# Patient Record
Sex: Male | Born: 1995 | Race: White | Hispanic: No | Marital: Single | Smoking: Never smoker
Health system: Southern US, Community
[De-identification: ages and names within clinical notes are randomized; demographics above are authoritative.]

## PROBLEM LIST (undated history)

## (undated) DIAGNOSIS — T7840XA Allergy, unspecified, initial encounter: Secondary | ICD-10-CM

## (undated) DIAGNOSIS — F329 Major depressive disorder, single episode, unspecified: Secondary | ICD-10-CM

## (undated) DIAGNOSIS — F32A Depression, unspecified: Secondary | ICD-10-CM

## (undated) DIAGNOSIS — L309 Dermatitis, unspecified: Secondary | ICD-10-CM

## (undated) DIAGNOSIS — B009 Herpesviral infection, unspecified: Secondary | ICD-10-CM

## (undated) DIAGNOSIS — F419 Anxiety disorder, unspecified: Secondary | ICD-10-CM

## (undated) HISTORY — DX: Major depressive disorder, single episode, unspecified: F32.9

## (undated) HISTORY — DX: Allergy, unspecified, initial encounter: T78.40XA

## (undated) HISTORY — DX: Anxiety disorder, unspecified: F41.9

## (undated) HISTORY — PX: TONSILLECTOMY: SUR1361

## (undated) HISTORY — DX: Depression, unspecified: F32.A

---
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
## 2015-04-03 ENCOUNTER — Emergency Department (HOSPITAL_COMMUNITY)
Admission: EM | Admit: 2015-04-03 | Discharge: 2015-04-04 | Disposition: A | Discharge status: Other Acute Inpatient Hospital | Payer: BC Managed Care – PPO | Attending: Emergency Medicine | Admitting: Emergency Medicine

## 2015-04-03 ENCOUNTER — Encounter (HOSPITAL_COMMUNITY): Payer: Self-pay | Admitting: Emergency Medicine

## 2015-04-03 DIAGNOSIS — Z880 Allergy status to penicillin: Secondary | ICD-10-CM | POA: Insufficient documentation

## 2015-04-03 DIAGNOSIS — Z872 Personal history of diseases of the skin and subcutaneous tissue: Secondary | ICD-10-CM | POA: Insufficient documentation

## 2015-04-03 DIAGNOSIS — F32A Depression, unspecified: Secondary | ICD-10-CM

## 2015-04-03 DIAGNOSIS — Z8619 Personal history of other infectious and parasitic diseases: Secondary | ICD-10-CM | POA: Insufficient documentation

## 2015-04-03 DIAGNOSIS — F329 Major depressive disorder, single episode, unspecified: Secondary | ICD-10-CM | POA: Diagnosis not present

## 2015-04-03 DIAGNOSIS — R45851 Suicidal ideations: Secondary | ICD-10-CM | POA: Diagnosis not present

## 2015-04-03 DIAGNOSIS — F322 Major depressive disorder, single episode, severe without psychotic features: Secondary | ICD-10-CM | POA: Diagnosis not present

## 2015-04-03 HISTORY — DX: Herpesviral infection, unspecified: B00.9

## 2015-04-03 HISTORY — DX: Dermatitis, unspecified: L30.9

## 2015-04-03 LAB — COMPREHENSIVE METABOLIC PANEL
ALT: 13 U/L — ABNORMAL LOW (ref 17–63)
AST: 17 U/L (ref 15–41)
Albumin: 4.7 g/dL (ref 3.5–5.0)
Alkaline Phosphatase: 51 U/L (ref 38–126)
Anion gap: 10 (ref 5–15)
BUN: 13 mg/dL (ref 6–20)
CHLORIDE: 105 mmol/L (ref 101–111)
CO2: 24 mmol/L (ref 22–32)
Calcium: 9.8 mg/dL (ref 8.9–10.3)
Creatinine, Ser: 0.85 mg/dL (ref 0.61–1.24)
GFR calc Af Amer: >60 mL/min (ref >60)
Glucose, Bld: 94 mg/dL (ref 65–99)
POTASSIUM: 3.8 mmol/L (ref 3.5–5.1)
SODIUM: 139 mmol/L (ref 135–145)
Total Bilirubin: 0.9 mg/dL (ref 0.3–1.2)
Total Protein: 7.9 g/dL (ref 6.5–8.1)

## 2015-04-03 LAB — CBC
HCT: 44.5 % (ref 39.0–52.0)
HEMOGLOBIN: 15.6 g/dL (ref 13.0–17.0)
MCH: 31 pg (ref 26.0–34.0)
MCHC: 35.1 g/dL (ref 30.0–36.0)
MCV: 88.3 fL (ref 78.0–100.0)
PLATELETS: 367 10*3/uL (ref 150–400)
RBC: 5.04 MIL/uL (ref 4.22–5.81)
RDW: 13 % (ref 11.5–15.5)
WBC: 12.5 10*3/uL — AB (ref 4.0–10.5)

## 2015-04-03 LAB — ACETAMINOPHEN LEVEL: Acetaminophen (Tylenol), Serum: <10 ug/mL — ABNORMAL LOW (ref 10–30)

## 2015-04-03 LAB — SALICYLATE LEVEL

## 2015-04-03 LAB — ETHANOL

## 2015-04-03 MED ORDER — ONDANSETRON HCL 4 MG PO TABS: 4.0000 mg | ORAL_TABLET | Freq: Three times a day (TID) | ORAL | Status: DC | PRN

## 2015-04-03 MED ORDER — ALUM & MAG HYDROXIDE-SIMETH 200-200-20 MG/5ML PO SUSP: 30.0000 mL | ORAL | Status: DC | PRN

## 2015-04-03 MED ORDER — ACETAMINOPHEN 325 MG PO TABS: 650.0000 mg | ORAL_TABLET | ORAL | Status: DC | PRN

## 2015-04-03 MED ORDER — IBUPROFEN 200 MG PO TABS: 600.0000 mg | ORAL_TABLET | Freq: Three times a day (TID) | ORAL | Status: DC | PRN

## 2015-04-03 MED ORDER — ZOLPIDEM TARTRATE 5 MG PO TABS: 5.0000 mg | ORAL_TABLET | Freq: Every evening | ORAL | Status: DC | PRN

## 2015-04-03 MED ORDER — NICOTINE 21 MG/24HR TD PT24: 21.0000 mg | MEDICATED_PATCH | Freq: Every day | TRANSDERMAL | Status: DC | PRN

## 2015-04-03 NOTE — ED Provider Notes (Signed)
CSN: [id number]     Arrival date & time 04/03/15  1620 History   First MD Initiated Contact with Patient 04/03/15 1739     Chief Complaint  Patient presents with  . Suicidal     (Consider location/radiation/quality/duration/timing/severity/associated sxs/prior Treatment) HPI   Steve Hodge is a 19 y.o. male who presents for evaluation of suicidal ideation, that has progressed over the last 2 months, and he now feels that he cannot keep himself safe. He has thought about jumping in front of a car. He is here to seek help at the request of his therapist. He takes hydroxyzine for anxiety, but nothing for depression. He has never had a suicide attempt. He has not had any recent illnesses. There are no other known modifying factors.   Past Medical History  Diagnosis Date  . Eczema   . HSV-1 infection    History reviewed. No pertinent past surgical history. History reviewed. No pertinent family history. Social History  Substance Use Topics  . Smoking status: Never Smoker   . Smokeless tobacco: None  . Alcohol Use: No    Review of Systems  All other systems reviewed and are negative.     Allergies  Amoxicillin  Home Medications   Prior to Admission medications   Medication Sig Start Date End Date Taking? Authorizing Provider  hydrOXYzine (ATARAX/VISTARIL) 10 MG tablet Take 1 tablet by mouth 2 (two) times daily as needed for anxiety.  03/25/15  Yes Historical Provider, MD  valACYclovir (VALTREX) 500 MG tablet Take 3-4 tablets by mouth daily as needed. Breakout. 02/26/15  Yes Historical Provider, MD   BP 115/60 mmHg  Pulse 63  Temp(Src) 97.3 F (36.3 C) (Oral)  Resp 16  SpO2 99% Physical Exam  Constitutional: He is oriented to person, place, and time. He appears well-developed and well-nourished. He appears distressed (he is uncomfortable).  HENT:  Head: Normocephalic and atraumatic.  Right Ear: External ear normal.  Left Ear: External ear normal.  Eyes:  Conjunctivae and EOM are normal. Pupils are equal, round, and reactive to light.  Neck: Normal range of motion and phonation normal. Neck supple.  Cardiovascular: Normal rate.   Pulmonary/Chest: Effort normal. He exhibits no bony tenderness.  Abdominal: Soft. There is no tenderness.  Musculoskeletal: Normal range of motion.  Neurological: He is alert and oriented to person, place, and time. No cranial nerve deficit or sensory deficit. He exhibits normal muscle tone. Coordination normal.  Skin: Skin is warm, dry and intact.  Psychiatric: His behavior is normal. Judgment and thought content normal.  Depressed, tearful  Nursing note and vitals reviewed.   ED Course  Procedures (including critical care time)  Medications  acetaminophen (TYLENOL) tablet 650 mg (not administered)  ibuprofen (ADVIL,MOTRIN) tablet 600 mg (not administered)  zolpidem (AMBIEN) tablet 5 mg (not administered)  nicotine (NICODERM CQ - dosed in mg/24 hours) patch 21 mg (not administered)  ondansetron (ZOFRAN) tablet 4 mg (not administered)  alum & mag hydroxide-simeth (MAALOX/MYLANTA) 200-200-20 MG/5ML suspension 30 mL (not administered)    Patient Vitals for the past 24 hrs:  BP Temp Temp src Pulse Resp SpO2  04/03/15 1702 115/60 mmHg 97.3 F (36.3 C) Oral 63 16 99 %    Consult TTS  9:37 PM Reevaluation with update and discussion. After initial assessment and treatment, an updated evaluation reveals no change in clinical status. Patient has spoken to TTS, and wants to go home at this time. He states that he will not act on his thoughts. I  explained to him that I was not comfortable with him leaving at this time. He understands and will stay to talk to the psychiatrist. He is at increased risk for suicide if leaving, and therefore should be committed if he tries to leave. Steve Hodge L    Labs Review Labs Reviewed  COMPREHENSIVE METABOLIC PANEL - Abnormal; Notable for the following:    ALT 13 (*)    All  other components within normal limits  ACETAMINOPHEN LEVEL - Abnormal; Notable for the following:    Acetaminophen (Tylenol), Serum <10 (*)    All other components within normal limits  CBC - Abnormal; Notable for the following:    WBC 12.5 (*)    All other components within normal limits  ETHANOL  SALICYLATE LEVEL  URINE RAPID DRUG SCREEN, HOSP PERFORMED    Imaging Review No results found. I have personally reviewed and evaluated these images and lab results as part of my medical decision-making.   EKG Interpretation None      MDM   Final diagnoses:  Suicidal ideation  Depression    Depression suicidal ideation, worsening, and not currently taking medications. Patient will be kept overnight for evaluation by psychiatry in the morning.  Nursing Notes Reviewed/ Care Coordinated, and agree without changes. Applicable Imaging Reviewed.  Interpretation of Laboratory Data incorporated into ED treatment  Plan: As per TTS in conjunction with oncoming provider team  Steve BaleElliott Kafi Dotter, MD 04/03/15 2139

## 2015-04-03 NOTE — ED Notes (Signed)
Patient is A&Ox4, denies SI/HI and A/V hallucinations. Patient tearful, sad, crying. Patient spoke about being lonely and needing someone to talk to. Patient's girlfriend broke up with him 2 months ago. He has [facility], he wrote a letter to his ex-girlfriend as part of his therapy and stated he felt good about the letter prior to sending it. Patient stated he texted his girlfriend today to see if she had gotten the letter. She told him she had and that she was happy with her life as it is now and will be moving to [place]. Patient went to the counseling center and they told him to come here because he had SI with 2 tentative plans, to OD on his medicine or to walk out into traffic. Patient currently states he is not SI but will let staff know if he no longer feels safe. Patient spoke about his [place] in [place], his sister lives in [place], and his brother lives in [place]. Patient also stated he has some friends here, but no one really close because it had always been him and his girlfriend.   Nelsy Madonna, Wyman SongsterAngela Marie, RN

## 2015-04-03 NOTE — BH Assessment (Signed)
Assessment completed. Consulted Hulan FessIjeoma Nwaeze, NP who agrees that pt should be re-evaluated in the am by psychiatry. Informed Dr. Effie ShyWentz of the recommendation.

## 2015-04-03 NOTE — ED Notes (Signed)
Pt reports SI with plan to jump in front of car or overdose on medications. Denies HI or substance abuse. Depression worsened by break up with girlfriend 2 months ago.

## 2015-04-03 NOTE — ED Notes (Signed)
Fathers Phone Number, [phone]. Steve Findersony Grave.

## 2015-04-03 NOTE — BH Assessment (Addendum)
Tele Assessment Note   Steve Hodge is an 19 y.o. male presenting to [facility] due to increasing depression. Pt stated "I am having trouble". "I went to the [facility] for a walk in session". "I was having a bad day". "I feel really depressed and sad". "I really just wanted to talk to someone". "I have thoughts it would be easier if this was over maybe this car could hit me". "No serious plans just the thought that it would injure me". "I feel miserable and I feel alone". Pt denies SI, HI and AVH at this time; however pt was referred to the ED by his therapist at [facility]. It has been documented by the EDP that pt feels that he cannot keep himself safe and he has thought about jumping in front of a car. Pt did not report any previous suicide attempts or self-injurious behaviors. Pt reported that his girlfriend broke up with him 2 months ago and he has been having trouble dealing with it. He is endorsing multiple depressive symptoms and shared that he has lost 10 lbs in the past 2 months. Pt also shared that he has trouble concentrating and stated "I can only think about the relationship and trying to fix it". "It's hard to get any work done". Pt reported that he drinks alcohol socially. Pt did not report any previous psychiatric hospitalizations and he has been having sessions at [facility] counseling center consistently since the breakup. PT did not report any physical, sexual or emotional abuse at this time.  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 Pt stated "I would like to be able to sleep in my own bed". "I would like to know how to feel better".  It is recommended that pt be evaluated by psychiatry in the am.   Diagnosis: Major Depressive Disorder, Single Episode, Moderate   Past Medical History:  Past Medical History  Diagnosis Date  . Eczema   . HSV-1 infection     History reviewed. No pertinent past surgical history.  Family History: History reviewed. No  pertinent family history.  Social History:  reports that he has never smoked. He does not have any smokeless tobacco history on file. He reports that he does not drink alcohol or use illicit drugs.  Additional Social History:     CIWA: CIWA-Ar BP: 115/60 mmHg Pulse Rate: 63 COWS:    PATIENT STRENGTHS: (choose at least two) Average or above average intelligence Supportive family/friends  Allergies:  Allergies  Allergen Reactions  . Amoxicillin     Had childhood reaction    Home Medications:  (Not in a hospital admission)  OB/GYN Status:  No LMP for male patient.  General Assessment Data Location of Assessment: [facility] ED TTS Assessment: In system Is this a Tele or Face-to-Face Assessment?: Face-to-Face Is this an Initial Assessment or a Re-assessment for this encounter?: Initial Assessment Marital status: Single Living Arrangements: Non-relatives/Friends Can pt return to current living arrangement?: Yes Admission Status: Voluntary Is patient capable of signing voluntary admission?: Yes Referral Source: Self/Family/Friend Insurance type: BCBS     Crisis Care Plan Living Arrangements: Non-relatives/Friends Name of Psychiatrist: [facility] Name of Therapist: [facility]  Education Status Is patient currently in school?: Yes Current Grade: Junior Highest grade of school patient has completed: Sophomore Name of school: Haematologist person: N/A  Risk to self with the past 6 months Suicidal Ideation: No-Not Currently/Within Last 6 Months Has patient been a risk to self within the  past 6 months prior to admission? : No Suicidal Intent: No-Not Currently/Within Last 6 Months Has patient had any suicidal intent within the past 6 months prior to admission? : No Is patient at risk for suicide?: Yes Suicidal Plan?: No-Not Currently/Within Last 6 Months Has patient had any suicidal plan within the past 6 months prior to admission? : No Access to Means: No What has been your use of  drugs/alcohol within the last 12 months?: Pt reported that he drinks alcohol socially.  Previous Attempts/Gestures: No How many times?: 0 Other Self Harm Risks: No other self harm risk identified at this time.   Triggers for Past Attempts: None known Intentional Self Injurious Behavior: None Family Suicide History: No Recent stressful life event(s): Other (Comment) (Break up with girlfriend ) Persecutory voices/beliefs?: No Depression: Yes Depression Symptoms: Despondent, Tearfulness, Fatigue, Loss of interest in usual pleasures, Feeling worthless/self pity, Guilt Substance abuse history and/or treatment for substance abuse?: No Suicide prevention information given to non-admitted patients: Not applicable  Risk to Others within the past 6 months Homicidal Ideation: No Does patient have any lifetime risk of violence toward others beyond the six months prior to admission? : No Thoughts of Harm to Others: No Current Homicidal Intent: No Current Homicidal Plan: No Access to Homicidal Means: No Identified Victim: N/A History of harm to others?: No Assessment of Violence: On admission Violent Behavior Description: No violent behaviors observed.  Does patient have access to weapons?: No Criminal Charges Pending?: No Does patient have a court date: No Is patient on probation?: No  Psychosis Hallucinations: None noted Delusions: None noted  Mental Status Report Appearance/Hygiene: In scrubs Eye Contact: Poor Motor Activity: Freedom of movement Speech: Logical/coherent Level of Consciousness: Quiet/awake Mood: Depressed, Sad Affect: Blunted Anxiety Level: Minimal Thought Processes: Coherent, Relevant Judgement: Unimpaired Orientation: Person, Place, Time, Situation Obsessive Compulsive Thoughts/Behaviors: Minimal  Cognitive Functioning Concentration: Decreased Memory: Recent Intact, Remote Intact IQ: Average Insight: Fair Impulse Control: Good Appetite: Fair Weight Loss:  10 (2 months ) Weight Gain: 0 Sleep: No Change Total Hours of Sleep: 6 Vegetative Symptoms: None  ADLScreening [facility] Assessment Services) Patient's cognitive ability adequate to safely complete daily activities?: Yes Patient able to express need for assistance with ADLs?: Yes Independently performs ADLs?: Yes (appropriate for developmental age)  Prior Inpatient Therapy Prior Inpatient Therapy: No Prior Therapy Dates: N/A Prior Therapy Facilty/Provider(s): N/A Reason for Treatment: N/A  Prior Outpatient Therapy Prior Outpatient Therapy: Yes Prior Therapy Dates: 8/16- present  Prior Therapy Facilty/Provider(s): [facility]  Reason for Treatment: Depression  Does patient have an ACCT team?: No Does patient have Intensive In-House Services?  : No Does patient have [facility] services? : No Does patient have P4CC services?: No  ADL Screening (condition at time of admission) Patient's cognitive ability adequate to safely complete daily activities?: Yes Is the patient deaf or have difficulty hearing?: No Does the patient have difficulty seeing, even when wearing glasses/contacts?: No Does the patient have difficulty concentrating, remembering, or making decisions?: No Patient able to express need for assistance with ADLs?: Yes Does the patient have difficulty dressing or bathing?: No Independently performs ADLs?: Yes (appropriate for developmental age)       Abuse/Neglect Assessment (Assessment to be complete while patient is alone) Physical Abuse: Denies Verbal Abuse: Denies Sexual Abuse: Denies Exploitation of patient/patient's resources: Denies Self-Neglect: Denies     Merchant navy officer (For Healthcare) Does patient have an advance directive?: No Would patient like information on creating an advanced directive?: No -  patient declined information    Additional Information 1:1 In Past 12 Months?: No CIRT Risk: No Elopement Risk: Yes Does patient have medical  clearance?: Yes     Disposition:  Disposition Initial Assessment Completed for this Encounter: Yes Disposition of Patient: Other dispositions (AM Psych eval )  Steve Hodge S 04/03/2015 8:45 PM

## 2015-04-03 NOTE — ED Notes (Signed)
Patient A&O x 3. Depressed, flat affect. Denies SI/HI and AVH. Expressed feelings of depression and sadness. Patient stated he also has racing thoughts contributing to his anxiety.  Cooperative and pleasant during assessment. Q 15 minute checks in progress. Monitored for safety.

## 2015-04-04 ENCOUNTER — Inpatient Hospital Stay (HOSPITAL_COMMUNITY)
Admission: AD | Admit: 2015-04-04 | Discharge: 2015-04-08 | DRG: 885 | Disposition: A | Discharge status: Home / Self Care | Payer: BC Managed Care – PPO | Attending: Psychiatry | Admitting: Psychiatry

## 2015-04-04 ENCOUNTER — Encounter (HOSPITAL_COMMUNITY): Payer: Self-pay | Admitting: *Deleted

## 2015-04-04 DIAGNOSIS — F322 Major depressive disorder, single episode, severe without psychotic features: Principal | ICD-10-CM | POA: Diagnosis present

## 2015-04-04 DIAGNOSIS — F419 Anxiety disorder, unspecified: Secondary | ICD-10-CM | POA: Diagnosis present

## 2015-04-04 DIAGNOSIS — G4700 Insomnia, unspecified: Secondary | ICD-10-CM | POA: Diagnosis present

## 2015-04-04 DIAGNOSIS — F329 Major depressive disorder, single episode, unspecified: Secondary | ICD-10-CM | POA: Diagnosis present

## 2015-04-04 DIAGNOSIS — L309 Dermatitis, unspecified: Secondary | ICD-10-CM | POA: Diagnosis present

## 2015-04-04 DIAGNOSIS — R45851 Suicidal ideations: Secondary | ICD-10-CM | POA: Diagnosis not present

## 2015-04-04 LAB — RAPID URINE DRUG SCREEN, HOSP PERFORMED
AMPHETAMINES: NOT DETECTED
BENZODIAZEPINES: NOT DETECTED
Barbiturates: NOT DETECTED
Cocaine: NOT DETECTED
Opiates: NOT DETECTED
TETRAHYDROCANNABINOL: NOT DETECTED

## 2015-04-04 MED ORDER — CITALOPRAM HYDROBROMIDE 20 MG PO TABS
20.0000 mg | ORAL_TABLET | Freq: Every day | ORAL | Status: DC
  Administered 2015-04-05 – 2015-04-08 (×4): 20 mg via ORAL
  Filled 2015-04-04 (×7): qty 1

## 2015-04-04 MED ORDER — HYDROCORTISONE VALERATE 0.2 % EX CREA
TOPICAL_CREAM | Freq: Two times a day (BID) | CUTANEOUS | Status: DC
  Administered 2015-04-04: 14:00:00 via TOPICAL
  Filled 2015-04-04: qty 15

## 2015-04-04 MED ORDER — HYDROXYZINE HCL 25 MG PO TABS
25.0000 mg | ORAL_TABLET | Freq: Four times a day (QID) | ORAL | Status: DC | PRN
  Administered 2015-04-04 – 2015-04-06 (×3): 25 mg via ORAL
  Filled 2015-04-04 (×4): qty 1

## 2015-04-04 MED ORDER — HYDROCORTISONE VALERATE 0.2 % EX CREA
TOPICAL_CREAM | Freq: Two times a day (BID) | CUTANEOUS | Status: DC
  Administered 2015-04-04: 22:00:00 via TOPICAL
  Filled 2015-04-04 (×2): qty 15

## 2015-04-04 MED ORDER — CITALOPRAM HYDROBROMIDE 20 MG PO TABS
20.0000 mg | ORAL_TABLET | Freq: Every day | ORAL | Status: DC
  Administered 2015-04-04: 20 mg via ORAL
  Filled 2015-04-04: qty 1

## 2015-04-04 MED ORDER — HYDROXYZINE HCL 25 MG PO TABS
25.0000 mg | ORAL_TABLET | Freq: Four times a day (QID) | ORAL | Status: DC | PRN
Start: 2015-04-04 — End: 2015-04-04

## 2015-04-04 MED ORDER — ONDANSETRON HCL 4 MG PO TABS: 4.0000 mg | ORAL_TABLET | Freq: Three times a day (TID) | ORAL | Status: DC | PRN

## 2015-04-04 MED ORDER — ACETAMINOPHEN 325 MG PO TABS: 650.0000 mg | ORAL_TABLET | ORAL | Status: DC | PRN

## 2015-04-04 MED ORDER — IBUPROFEN 600 MG PO TABS: 600.0000 mg | ORAL_TABLET | Freq: Three times a day (TID) | ORAL | Status: DC | PRN

## 2015-04-04 NOTE — Progress Notes (Signed)
1:34pm. Pt has been accepted to [facility]. Pt is is in process of being IVC'd. Once IVC'd, pt will require Hydrographic surveyorlaw enforcement officer transport to [facility].  York SpanielAlexandra Yeiren [facility] Clinical Social Worker [facility] phone: [phone]

## 2015-04-04 NOTE — Progress Notes (Signed)
D: Patient observed in milieu sitting with peers with minimal interaction. Sat with patient and spoke with him. Patient currently denies SI/HI A/V hallucinations. Patient states his goal was to "get resources to cope through the day." Patient with flat and sad affect. Patient doesn't initiate conversation on his own he has to be engaged by Clinical research associatewriter.  A: patient offered encouragement and support. Encouraged patient to express his feelings and thoughts and to talk with staff about what he is thinking and feeling. Patient asked for medication for anxiety tonight prior to going to bed. Patient administered vistaril 25 mg as ordered and patient is taking scheduled medications as ordered. Patient encouraged to attend groups and participate. Q 15 minute checks in progress and maintained for safety. R: Patient with relief noted from anxiety medication. Patient voices no other concerns at this time. Patient receptive to treatment and safety maintain.

## 2015-04-04 NOTE — Progress Notes (Addendum)
Steve Hodge is a 19 yo caucasian male who is a Paramedicjunior college student at [employer], who went to [facility] ED due to intense feelings of depression oand despair. He lives with 3 roommates, denied association with drugs and stated he had no prior psychiatric history, but is able to identify that he has not been able to " get over" his girl friend breaking up with him 2 months ago. He  said " I feel so alone" . He shared that he had lost 10 lbs in the past 2 months and that he has been experiencing fleeting thoughts of jumping out in front of a car. He has no other medical history, contracts for safety and is admitted to unit.

## 2015-04-04 NOTE — Consult Note (Signed)
[facility] Face-to-Face Psychiatry Consult   Reason for Consult:   Depression Referring Physician:   ED Physician Patient Identification: Steve Hodge MRN:  [id number] Principal Diagnosis:  Major Depression, Severe, no psychotic features  Diagnosis:  Major Depression, Severe, no psychotic features   Total Time spent with patient: 30 minutes  Subjective:   Steve Hodge is a 19 y.o. 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 At patient's request father at bedside and provided collateral information.  Reports he has been depressed due to break up with his GF several weeks ago. Presented to outpatient provider at [facility], and reported worsening depression and suicidal ideations, of either overdosing or of walking into traffic . Due to this came to ED. Patient describes several neuro-vegetative symptoms of severe depression to include anhedonia,  Fair appetite, loss of about 10 lbs over recent weeks, and frequent episodes of crying . He acknowledges he has been having suicidal ideations although at this time denies plan or intention of carrying these out. His grades have gone down compared to before, and he has been missing classes recently due to the severity of his depression. Patient states he is feeling better today, stating he wants to be discharged,  but presents quite sad , often tearful, easily agitated + distressed during session. He has a history of eczema, and  Is noted to have significantly compromised skin on hands bilaterally. Father states that patient has history of some OCD symptoms, and reports he thinks patient has been washing his hands repeatedly and compulsively, leading to above lesions. Syler denies any psychotic symptoms , denies any history of mania or hypomania, endorses history of previous depressive episodes, but " never this bad ". Has not been on psychiatric medications in the past       Past  Psychiatric History:  Depression  Risk to Self: Suicidal Ideation: No-Not Currently/Within Last 6 Months Suicidal Intent: No-Not Currently/Within Last 6 Months Is patient at risk for suicide?: Yes Suicidal Plan?: No-Not Currently/Within Last 6 Months Access to Means: No What has been your use of drugs/alcohol within the last 12 months?: Pt reported that he drinks alcohol socially.  How many times?: 0 Other Self Harm Risks: No other self harm risk identified at this time.   Triggers for Past Attempts: None known Intentional Self Injurious Behavior: None Risk to Others: Homicidal Ideation: No Thoughts of Harm to Others: No Current Homicidal Intent: No Current Homicidal Plan: No Access to Homicidal Means: No Identified Victim: N/A History of harm to others?: No Assessment of Violence: On admission Violent Behavior Description: No violent behaviors observed.  Does patient have access to weapons?: No Criminal Charges Pending?: No Does patient have a court date: No Prior Inpatient Therapy: Prior Inpatient Therapy: No Prior Therapy Dates: N/A Prior Therapy Facilty/Provider(s): N/A Reason for Treatment: N/A Prior Outpatient Therapy: Prior Outpatient Therapy: Yes Prior Therapy Dates: 8/16- present  Prior Therapy Facilty/Provider(s): [place]  Reason for Treatment: Depression  Does patient have an ACCT team?: No Does patient have Intensive In-House Services?  : No Does patient have [facility] services? : No Does patient have P4CC services?: No  Past Medical History:  Past Medical History  Diagnosis Date  . Eczema   . HSV-1 infection    History reviewed. No pertinent past surgical history. Family History: History reviewed. No pertinent family history. Family Psychiatric  History:  Barbaraann Rondo has history of OCD  Social History:  History  Alcohol Use No     History  Drug Use No    Social History   Social History  . Marital Status: Single    Spouse Name: N/A  . Number  of Children: N/A  . Years of Education: N/A   Social History Main Topics  . Smoking status: Never Smoker   . Smokeless tobacco: None  . Alcohol Use: No  . Drug Use: No  . Sexual Activity: Not Asked   Other Topics Concern  . None   Social History Narrative  . None   Additional Social History:  Allergies:   Allergies  Allergen Reactions  . Amoxicillin     Had childhood reaction    Labs:  Results for orders placed or performed during the hospital encounter of 04/03/15 (from the past 48 hour(s))  Comprehensive metabolic panel     Status: Abnormal   Collection Time: 04/03/15  5:38 PM  Result Value Ref Range   Sodium 139 135 - 145 mmol/L   Potassium 3.8 3.5 - 5.1 mmol/L   Chloride 105 101 - 111 mmol/L   CO2 24 22 - 32 mmol/L   Glucose, Bld 94 65 - 99 mg/dL   BUN 13 6 - 20 mg/dL   Creatinine, Ser 0.85 0.61 - 1.24 mg/dL   Calcium 9.8 8.9 - 10.3 mg/dL   Total Protein 7.9 6.5 - 8.1 g/dL   Albumin 4.7 3.5 - 5.0 g/dL   AST 17 15 - 41 U/L   ALT 13 (L) 17 - 63 U/L   Alkaline Phosphatase 51 38 - 126 U/L   Total Bilirubin 0.9 0.3 - 1.2 mg/dL   GFR calc non Af Amer >60 >60 mL/min   GFR calc Af Amer >60 >60 mL/min    Comment: (NOTE) The eGFR has been calculated using the CKD EPI equation. This calculation has not been validated in all clinical situations. eGFR's persistently <60 mL/min signify possible Chronic Kidney Disease.    Anion gap 10 5 - 15  CBC     Status: Abnormal   Collection Time: 04/03/15  5:38 PM  Result Value Ref Range   WBC 12.5 (H) 4.0 - 10.5 K/uL   RBC 5.04 4.22 - 5.81 MIL/uL   Hemoglobin 15.6 13.0 - 17.0 g/dL   HCT 44.5 39.0 - 52.0 %   MCV 88.3 78.0 - 100.0 fL   MCH 31.0 26.0 - 34.0 pg   MCHC 35.1 30.0 - 36.0 g/dL   RDW 13.0 11.5 - 15.5 %   Platelets 367 150 - 400 K/uL  Ethanol (ETOH)     Status: None   Collection Time: 04/03/15  5:39 PM  Result Value Ref Range   Alcohol, Ethyl (B) <5 <5 mg/dL    Comment:        LOWEST DETECTABLE LIMIT FOR SERUM  ALCOHOL IS 5 mg/dL FOR MEDICAL PURPOSES ONLY   Salicylate level     Status: None   Collection Time: 04/03/15  5:39 PM  Result Value Ref Range   Salicylate Lvl <6.7 2.8 - 30.0 mg/dL  Acetaminophen level     Status: Abnormal   Collection Time: 04/03/15  5:39 PM  Result Value Ref Range   Acetaminophen (Tylenol), Serum <10 (L) 10 - 30 ug/mL    Comment:        THERAPEUTIC CONCENTRATIONS VARY SIGNIFICANTLY. A RANGE OF 10-30 ug/mL MAY BE AN EFFECTIVE CONCENTRATION FOR MANY PATIENTS. HOWEVER, SOME ARE BEST TREATED AT CONCENTRATIONS OUTSIDE THIS RANGE. ACETAMINOPHEN CONCENTRATIONS >150  ug/mL AT 4 HOURS AFTER INGESTION AND >50 ug/mL AT 12 HOURS AFTER INGESTION ARE OFTEN ASSOCIATED WITH TOXIC REACTIONS.     Current Facility-Administered Medications  Medication Dose Route Frequency Provider Last Rate Last Dose  . acetaminophen (TYLENOL) tablet 650 mg  650 mg Oral Q4H PRN Daleen Bo, MD      . alum & mag hydroxide-simeth (MAALOX/MYLANTA) 200-200-20 MG/5ML suspension 30 mL  30 mL Oral PRN Daleen Bo, MD      . ibuprofen (ADVIL,MOTRIN) tablet 600 mg  600 mg Oral Q8H PRN Daleen Bo, MD      . nicotine (NICODERM CQ - dosed in mg/24 hours) patch 21 mg  21 mg Transdermal Daily PRN Daleen Bo, MD      . ondansetron [facility]) tablet 4 mg  4 mg Oral Q8H PRN Daleen Bo, MD      . zolpidem (AMBIEN) tablet 5 mg  5 mg Oral QHS PRN Daleen Bo, MD       Current Outpatient Prescriptions  Medication Sig Dispense Refill  . hydrOXYzine (ATARAX/VISTARIL) 10 MG tablet Take 1 tablet by mouth 2 (two) times daily as needed for anxiety.     . valACYclovir (VALTREX) 500 MG tablet Take 3-4 tablets by mouth daily as needed. Breakout.      Musculoskeletal: Strength & Muscle Tone: within normal limits Gait & Station: normal Patient leans: N/A  Psychiatric Specialty Exam: ROS worsening eczema on hands, weight loss.  Blood pressure 130/71, pulse 93, temperature 98.3 F (36.8 C), temperature  source Oral, resp. rate 20, SpO2 100 %.There is no height or weight on file to calculate BMI.  General Appearance: Fairly Groomed  Engineer, water::  Good  Speech:  Normal Rate  Volume:  Normal  Mood:  Depressed  Affect:  Constricted and Tearful- tearful at times   Thought Process:  Goal Directed and Linear  Orientation:  Full (Time, Place, and Person)  Thought Content:  Rumination and denies hallucinations, no delusions, does not appear internally preoccupied  Ruminative about and grieving break up with GF   Suicidal Thoughts:  Yes.  without intent/plan- at this time denies any plan or intention of hurting self , but as noted, recently expressed suicidal thoughts of overdosing or of walking into traffic .  Homicidal Thoughts:  No  Memory:  recent and remote grossly intact   Judgement:  Fair  Insight:  Fair  Psychomotor Activity:  Decreased  Concentration:  Good  Recall:  Good  Fund of Knowledge:Good  Language: Good  Akathisia:  Negative  Handed:  Right  AIMS (if indicated):     Assets:  Communication Skills Resilience  ADL's:   Fair   Cognition: WNL  Sleep:      Treatment Plan Summary: Plan inpatient admission is warranted due to severity of depression and suicidal ideations with plan and treatment team working on admission process   Disposition: Recommend psychiatric Inpatient admission when medically cleared. patient meets criteria for IVC commitment   Patient is agreeing to antidepressant medication management - we discussed options- agrees to [place], [facility] 04/04/2015 12:48 PM

## 2015-04-04 NOTE — BHH Counselor (Signed)
Patient requested to speak with a counselor. Patient and his father asked questions about being released. This Clinical research associatewriter explained that inpatient was recommended by the psychiatric team due to concerns for his safety.  Patient was informed that he could sign in voluntarily and will be seen by a psychiatrist and will be evaluated and request to sign out voluntarily if needed.  Patient was informed that the psychiatrist would recommend involuntary commitment if he would not agree to come in. Patient was upset and started to yell at this writer and his father stating that he was mislead into coming in here and wanted to leave. Patient states that he was lied to and feels that he should get to go home and return to school and "be comfortable." Patient was informed that he would be IVC'd if he is not willing to come into the hospital for his safety. Informed Dr. Jama Flavorsobos that patient not willing to sign in. Patient IVC'd and faxed to the magistrate.   Davina PokeJoVea Almee Pelphrey, LCSW Therapeutic Triage Specialist [facility] 04/04/2015 1:28 PM

## 2015-04-04 NOTE — Tx Team (Signed)
Initial Interdisciplinary Treatment Plan   PATIENT STRESSORS: Relationship issues   PATIENT STRENGTHS: Ability for insight Average or above average intelligence   PROBLEM LIST: Problem List/Patient Goals Date to be addressed Date deferred Reason deferred Estimated date of resolution  Depression 04/04/15                                                      DISCHARGE CRITERIA:  Improved stabilization in mood, thinking, and/or behavior Need for constant or close observation no longer present  PRELIMINARY DISCHARGE PLAN: Return to previous living arrangement  PATIENT/FAMIILY INVOLVEMENT: This treatment plan has been presented to and reviewed with the patient, Steve BannerRobert Hodge, and/or family member.  The patient and family have been given the opportunity to ask questions and make suggestions.  Steve BalintForrest, Steve Hodge 04/04/2015, 6:18 PM

## 2015-04-04 NOTE — Progress Notes (Signed)
3:00pm. Pt placed under IVC by [place]. Per Juleen ChinaMagistrate Jenkins, paperwork has been approved and law enforcement has been dispatched. CSW faxed and filed paperwork.  York SpanielAlexandra Pharell Rolfson Connally Memorial Medical CenterCSWA Clinical Social Worker [facility] phone: [phone]

## 2015-04-05 ENCOUNTER — Encounter (HOSPITAL_COMMUNITY): Payer: Self-pay | Admitting: Registered Nurse

## 2015-04-05 DIAGNOSIS — F322 Major depressive disorder, single episode, severe without psychotic features: Secondary | ICD-10-CM | POA: Diagnosis present

## 2015-04-05 MED ORDER — TRIAMCINOLONE ACETONIDE 0.1 % EX CREA
TOPICAL_CREAM | Freq: Two times a day (BID) | CUTANEOUS | Status: DC
  Filled 2015-04-05: qty 15

## 2015-04-05 MED ORDER — TRIAMCINOLONE ACETONIDE 0.025 % EX CREA
TOPICAL_CREAM | Freq: Two times a day (BID) | CUTANEOUS | Status: DC
  Administered 2015-04-05 – 2015-04-07 (×6): via TOPICAL
  Filled 2015-04-05: qty 80

## 2015-04-05 MED ORDER — HYDROCERIN EX CREA
TOPICAL_CREAM | Freq: Two times a day (BID) | CUTANEOUS | Status: DC
  Administered 2015-04-05 – 2015-04-07 (×5): via TOPICAL
  Filled 2015-04-05: qty 113

## 2015-04-05 NOTE — [facility] Suicide Risk Assessment (Signed)
[facility]   Nursing information obtained from:  Patient Demographic factors:  Male Current Mental Status:  Suicidal ideation indicated by patient Loss Factors:  Loss of significant relationship Historical Factors:  NA Risk Reduction Factors:  Sense of responsibility to family Total Time spent with patient: 1.5 hours Principal Problem: <principal problem not specified> Diagnosis:   Patient Active Problem List   Diagnosis Date Noted  . MDD (major depressive disorder), single episode [F32.9] 04/04/2015     Continued Clinical Symptoms:  Alcohol Use Disorder Identification Test Final Score (AUDIT): 0 The "Alcohol Use Disorders Identification Test", Guidelines for Use in Primary Care, Second Edition.  World Science writerHealth Organization [facility](WHO). Score between 0-7:  no or low risk or alcohol related problems. Score between 8-15:  moderate risk of alcohol related problems. Score between 16-19:  high risk of alcohol related problems. Score 20 or above:  warrants further diagnostic evaluation for alcohol dependence and treatment.   CLINICAL FACTORS:   Depression:   Anhedonia Hopelessness Dysthymia Unstable or Poor Therapeutic Relationship   Musculoskeletal: Strength & Muscle Tone: within normal limits Gait & Station: normal Patient leans: N/A  Psychiatric Specialty Exam: Physical Exam  HENT:  Head: Normocephalic and atraumatic.    Review of Systems  Constitutional: Negative.   Cardiovascular: Negative for chest pain.  Neurological: Negative for tremors.  Psychiatric/Behavioral: Positive for depression. The patient is nervous/anxious.     Blood pressure 128/82, pulse 69, temperature 97.6 F (36.4 C), temperature source Oral, resp. rate 18, height 6\' 1"  (1.854 m), weight 64.864 kg (143 lb).Body mass index is 18.87 kg/(m^2).  General Appearance: Casual  Eye Contact::  Fair  Speech:  Normal Rate  Volume:  Decreased  Mood:  Depressed  Affect:  Congruent   Thought Process:  Coherent  Orientation:  Full (Time, Place, and Person)  Thought Content:  Rumination  Suicidal Thoughts:  No  Homicidal Thoughts:  No  Memory:  Immediate;   Fair Recent;   Fair  Judgement:  Poor  Insight:  Shallow  Psychomotor Activity:  Normal  Concentration:  Fair  Recall:  Fis[employer]Fair  Fund of Knowledge:Fair  Language: Fair  Akathisia:  Negative  Handed:  Right  AIMS (if indicated):     Assets:  Communication Skills Physical Health  Sleep:  Number of Hours: 6.25  Cognition: WNL  ADL's:  Intact     COGNITIVE FEATURES THAT CONTRIBUTE TO RISK:  Closed-mindedness    SUICIDE RISK:   Moderate:  Frequent suicidal ideation with limited intensity, and duration, some specificity in terms of plans, no associated intent, good self-control, limited dysphoria/symptomatology, some risk factors present, and identifiable protective factors, including available and accessible social support.  PLAN OF CARE: Admit for stabilization. Medication management for depression and safety.    Medical Decision Making:  Review of Psycho-Social Stressors (1), Review or order clinical lab tests (1), Decision to obtain old records (1) and Review of New Medication or Change in Dosage (2)  I certify that inpatient services furnished can reasonably be expected to improve the patient's condition.   Lezlie Ritchey 04/05/2015, 9:44 AM

## 2015-04-05 NOTE — B[facility] Group Notes (Addendum)
[facility] Group Notes:  (Nursing/MHT/Case Management/Adjunct)  Date:  04/05/2015  Time:  1045   Type of Therapy:  Nurse Education  /  Healthy Support systems: The groups is focused on teaching patients the importance of developing and maintianing health support systems.   Participation Level:  Active  Participation Quality:  Attentive  Affect:  Appropriate  Cognitive:  Oriented  Insight:  Appropriate  Engagement in Group:  Engaged  Modes of Intervention:  Education  Summary of Progress/Problems:  Rich BraveDuke, Debbrah Sampedro Lynn 04/05/2015, 2:18 PM

## 2015-04-05 NOTE — Progress Notes (Signed)
Adult Psychoeducational Group Note  Date:  04/05/2015 Time:  9:17 PM  Group Topic/Focus:  Wrap-Up Group:   The focus of this group is to help patients review their daily goal of treatment and discuss progress on daily workbooks.  Participation Level:  Active  Participation Quality:  Appropriate and Attentive  Affect:  Appropriate  Cognitive:  Appropriate  Insight: Appropriate and Good  Engagement in Group:  Engaged  Modes of Intervention:  Education  Additional Comments:  Pt goal was to try and think about solutions to his problems and how to accept situations that don't go his way.   Merlinda FrederickKeshia S Vinessa Macconnell 04/05/2015, 9:17 PM

## 2015-04-05 NOTE — Progress Notes (Signed)
D Steve MaduroRobert is quiet. He is sad but cooperative and comfortable in the milieu. H attends his groups. HE is appreciative of help offered him. He is compliant with his medication regimen and he asks appropriate questions regarding his poc.    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 This Clinical research associatewriter will assist hm to communicate with professor to assure employment is maintained. Pt contracts for safety and denies SI / HI.   R Safety is in place.

## 2015-04-05 NOTE — H&P (Signed)
Psychiatric Admission Assessment Adult  Patient Identification: Steve Hodge MRN:  [id number] Date of Evaluation:  04/05/2015 Chief Complaint:  MDD,SINGLE EPISODE,MODERATE Principal Diagnosis: MDD (major depressive disorder), single episode, severe , no psychosis ([place]) Diagnosis:   Patient Active Problem List   Diagnosis Date Noted  . MDD (major depressive disorder), single episode, severe , no psychosis ([place]) [F32.2] 04/05/2015  . MDD (major depressive disorder), single episode [F32.9] 04/04/2015   History of Present Illness:: Patient states "I go to counseling at [facility] and I've been having a hard time lately.  I went to counseling cause I felt the need to talk to somebody so I went to one of the walking sessions; and they asked me a lot of questions like are you feeling suicidal.  I was like when I'm waking down the street sometime I think if someone hits me it's not a big deal.  I never took the thoughts seriously; never thought I would do anything like that; figured I was just feeling really down and low at the time."  Patient states that he recently broke up with his girlfriend that he had been dating for 2 1/2 years.  "I really thought that it was it for me; I had my future plan for my family; I thought we would be together forever, until she told me that she did not feel the same way anymore and that she was moving to [place].  She doesn't talk to me anymore.  I wrote her a letter to get some closure and just to let her know that I was here for here if she needed me; when I didn't get a response back I called her she said she had gotten the letter but didn't really have anything to say. It just hurt so much to have someone walk away when you you've done nothing wrong or you don't know why that person doesn't love you any more." Patient states that he has had some passive suicidal thoughts but doesn't feel that he would do anything to hurt himself.  States that he has no history of prior  suicide attempt or self injurious behavior.  Outpatient services with [facility] counseling (susan) and going to start medication management with Lenor Coffin NP).  Denies prior inpatient hospitalization. Patient states that his parents are very supportive and lives in [place], [place]; he has a brother in [place] and a sister in [place] whom are also supportive and have been calling to talk with him.  He is living with two other guys in an apartment who are his friends and supportive also.  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  Denies any issues with self esteem, or social anxiety.  States that he had depression once before when in highschool but in general he is usually a happy person without any worries.  "This break up for no reason is what got me so down."   At this time patient is denying suicidal ideation and is able to contract for safety.  States that he really wanted to get started on medication and continue with his outpatient services.  States that he does not want to miss to many days in school or he will start failing and "don't need that as a stressor."   Associated Signs/Symptoms: Depression Symptoms:  depressed mood, anhedonia, insomnia, difficulty concentrating, hopelessness, anxiety, decreased appetite, (Hypo) Manic Symptoms:  Distractibility, Irritable Mood, Anxiety Symptoms:  Excessive Worry,  Psychotic Symptoms:  Denies PTSD Symptoms: Denies Total Time spent with patient: 1 hour  Past Psychiatric History: Depression.   Risk to Self: Is patient at risk for suicide?: Yes What has been your use of drugs/alcohol within the last 12 months?: pt denies Risk to Others:   Prior Inpatient Therapy:  No Prior Outpatient Therapy:  Yes with [employer] counseling and just starting medication management with Lenor Coffin NP.  Alcohol Screening: 1. How often do you have a drink containing alcohol?: Never 9. Have you or someone else  been injured as a result of your drinking?: No 10. Has a relative or friend or a doctor or another health worker been concerned about your drinking or suggested you cut down?: No Alcohol Use Disorder Identification Test Final Score (AUDIT): 0 Brief Intervention: AUDIT score less than 7 or less-screening does not suggest unhealthy drinking-brief intervention not indicated Substance Abuse History in the last 12 months:  No. Consequences of Substance Abuse: NA Previous Psychotropic Medications: No  Psychological Evaluations: No  Past Medical History:  Past Medical History  Diagnosis Date  . Eczema   . HSV-1 infection    History reviewed. No pertinent past surgical history. Family History: History reviewed. No pertinent family history. Family Psychiatric  History: No history of mental illness Social History:  History  Alcohol Use No     History  Drug Use No    Social History   Social History  . Marital Status: Single    Spouse Name: N/A  . Number of Children: N/A  . Years of Education: N/A   Social History Main Topics  . Smoking status: Never Smoker   . Smokeless tobacco: None  . Alcohol Use: No  . Drug Use: No  . Sexual Activity: Not Asked   Other Topics Concern  . None   Social History Narrative   Additional Social History:    Pain Medications: none Prescriptions: none Over the Counter: none History of alcohol / drug use?: No history of alcohol / drug abuse  Allergies:   Allergies  Allergen Reactions  . Amoxicillin     Had childhood reaction   Lab Results:  Results for orders placed or performed during the hospital encounter of 04/03/15 (from the past 48 hour(s))  Comprehensive metabolic panel     Status: Abnormal   Collection Time: 04/03/15  5:38 PM  Result Value Ref Range   Sodium 139 135 - 145 mmol/L   Potassium 3.8 3.5 - 5.1 mmol/L   Chloride 105 101 - 111 mmol/L   CO2 24 22 - 32 mmol/L   Glucose, Bld 94 65 - 99 mg/dL   BUN 13 6 - 20 mg/dL    Creatinine, Ser 0.85 0.61 - 1.24 mg/dL   Calcium 9.8 8.9 - 10.3 mg/dL   Total Protein 7.9 6.5 - 8.1 g/dL   Albumin 4.7 3.5 - 5.0 g/dL   AST 17 15 - 41 U/L   ALT 13 (L) 17 - 63 U/L   Alkaline Phosphatase 51 38 - 126 U/L   Total Bilirubin 0.9 0.3 - 1.2 mg/dL   GFR calc non Af Amer >60 >60 mL/min   GFR calc Af Amer >60 >60 mL/min    Comment: (NOTE) The eGFR has been calculated using the CKD EPI equation. This calculation has not been validated in all clinical situations. eGFR's persistently <60 mL/min signify possible Chronic Kidney Disease.    Anion gap 10 5 - 15  CBC     Status: Abnormal  Collection Time: 04/03/15  5:38 PM  Result Value Ref Range   WBC 12.5 (H) 4.0 - 10.5 K/uL   RBC 5.04 4.22 - 5.81 MIL/uL   Hemoglobin 15.6 13.0 - 17.0 g/dL   HCT 44.5 39.0 - 52.0 %   MCV 88.3 78.0 - 100.0 fL   MCH 31.0 26.0 - 34.0 pg   MCHC 35.1 30.0 - 36.0 g/dL   RDW 13.0 11.5 - 15.5 %   Platelets 367 150 - 400 K/uL  Ethanol (ETOH)     Status: None   Collection Time: 04/03/15  5:39 PM  Result Value Ref Range   Alcohol, Ethyl (B) <5 <5 mg/dL    Comment:        LOWEST DETECTABLE LIMIT FOR SERUM ALCOHOL IS 5 mg/dL FOR MEDICAL PURPOSES ONLY   Salicylate level     Status: None   Collection Time: 04/03/15  5:39 PM  Result Value Ref Range   Salicylate Lvl <8.4 2.8 - 30.0 mg/dL  Acetaminophen level     Status: Abnormal   Collection Time: 04/03/15  5:39 PM  Result Value Ref Range   Acetaminophen (Tylenol), Serum <10 (L) 10 - 30 ug/mL    Comment:        THERAPEUTIC CONCENTRATIONS VARY SIGNIFICANTLY. A RANGE OF 10-30 ug/mL MAY BE AN EFFECTIVE CONCENTRATION FOR MANY PATIENTS. HOWEVER, SOME ARE BEST TREATED AT CONCENTRATIONS OUTSIDE THIS RANGE. ACETAMINOPHEN CONCENTRATIONS >150 ug/mL AT 4 HOURS AFTER INGESTION AND >50 ug/mL AT 12 HOURS AFTER INGESTION ARE OFTEN ASSOCIATED WITH TOXIC REACTIONS.   Urine rapid drug screen (hosp performed) (Not at [facility])     Status: None   Collection  Time: 04/04/15 12:56 PM  Result Value Ref Range   Opiates NONE DETECTED NONE DETECTED   Cocaine NONE DETECTED NONE DETECTED   Benzodiazepines NONE DETECTED NONE DETECTED   Amphetamines NONE DETECTED NONE DETECTED   Tetrahydrocannabinol NONE DETECTED NONE DETECTED   Barbiturates NONE DETECTED NONE DETECTED    Comment:        DRUG SCREEN FOR MEDICAL PURPOSES ONLY.  IF CONFIRMATION IS NEEDED FOR ANY PURPOSE, NOTIFY LAB WITHIN 5 DAYS.        LOWEST DETECTABLE LIMITS FOR URINE DRUG SCREEN Drug Class       Cutoff (ng/mL) Amphetamine      1000 Barbiturate      200 Benzodiazepine   132 Tricyclics       440 Opiates          300 Cocaine          300 THC              50     Metabolic Disorder Labs:  No results found for: HGBA1C, MPG No results found for: PROLACTIN No results found for: CHOL, TRIG, HDL, CHOLHDL, VLDL, LDLCALC  Current Medications: Current Facility-Administered Medications  Medication Dose Route Frequency Provider Last Rate Last Dose  . acetaminophen (TYLENOL) tablet 650 mg  650 mg Oral Q4H PRN Lurena Nida, NP      . citalopram (CELEXA) tablet 20 mg  20 mg Oral Daily Lurena Nida, NP   20 mg at 04/05/15 1206  . hydrocerin (EUCERIN) cream   Topical BID Jenne Campus, MD      . hydrOXYzine (ATARAX/VISTARIL) tablet 25 mg  25 mg Oral Q6H PRN Lurena Nida, NP   25 mg at 04/04/15 2140  . ibuprofen (ADVIL,MOTRIN) tablet 600 mg  600 mg Oral Q8H PRN Lurena Nida, NP      .  ondansetron (ZOFRAN) tablet 4 mg  4 mg Oral Q8H PRN Lurena Nida, NP      . triamcinolone (KENALOG) 0.025 % cream   Topical BID Jenne Campus, MD       PTA Medications: Prescriptions prior to admission  Medication Sig Dispense Refill Last Dose  . hydrOXYzine (ATARAX/VISTARIL) 10 MG tablet Take 1 tablet by mouth 2 (two) times daily as needed for anxiety.    Unknown at Unknown time  . valACYclovir (VALTREX) 500 MG tablet Take 3-4 tablets by mouth daily as needed. Breakout.   Unknown at Unknown  time    Musculoskeletal: Strength & Muscle Tone: within normal limits Gait & Station: normal Patient leans: N/A  Psychiatric Specialty Exam: Physical Exam  Constitutional: He is oriented to person, place, and time.  Respiratory: Effort normal.  Musculoskeletal: Normal range of motion.  Neurological: He is alert and oriented to person, place, and time.  Skin:  Eczema.  Skin really dry flaking, cracking.  Hands bilaterally cracked skin from dry skin and redden ares from dry skin and itching.  Around eyes, and bridge of nose, upper extremities bilaterally.    Discussed skin care with patient to prevent dryness and infections.  Understanding voiced     Psychiatric: His speech is normal and behavior is normal. Cognition and memory are normal. He expresses impulsivity. He exhibits a depressed mood. He expresses suicidal (Passive) ideation.    Review of Systems  Skin: Positive for itching.       Eczema.  Skin really dry flaking, cracking upper extremities, hands bilaterally.  Facial area bridge of nose and around eye    Psychiatric/Behavioral: Positive for depression. Suicidal ideas: Passive. Hallucinations: Denies. Substance abuse: denies. The patient is nervous/anxious and has insomnia.   All other systems reviewed and are negative.   Blood pressure 128/82, pulse 69, temperature 97.6 F (36.4 C), temperature source Oral, resp. rate 18, height 6' 1"  (1.854 m), weight 64.864 kg (143 lb).Body mass index is 18.87 kg/(m^2).  General Appearance: Casual  Eye Contact::  Good  Speech:  Clear and Coherent and Normal Rate  Volume:  Normal  Mood:  Depressed and Tearful  Affect:  Depressed and Tearful  Thought Process:  Circumstantial, Coherent and Goal Directed  Orientation:  Full (Time, Place, and Person)  Thought Content:  Denies hallucinations, delusions, and paranoia  Suicidal Thoughts:  Denies at this time  Homicidal Thoughts:  No  Memory:  Immediate;   Good Recent;   Good Remote;    Good  Judgement:  Intact  Insight:  Present  Psychomotor Activity:  Normal  Concentration:  Good  Recall:  Good  Fund of Knowledge:Good  Language: Good  Akathisia:  No  Handed:  Right  AIMS (if indicated):     Assets:  Communication Skills Desire for Improvement Housing Physical Health Social Support Transportation Vocational/Educational  ADL's:  Intact  Cognition: WNL  Sleep:  Number of Hours: 6.25     Treatment Plan Summary: Daily contact with patient to assess and evaluate symptoms and progress in treatment and Medication management  1. Admit for crisis management and stabilization 2. Medication management to reduce current symptoms to bale line and improve the patient's overall level of functioning:  Started Celexa 20 mg daily for depression; Eucerin cream for Eczema/dry skin; Kenalog 0.25% for Eczema.  3. Treat health problems as indicated 4. Develop treatment plan to decrease risk of relapse upon discharge and the need for readmission. 5. Psycho-social education regarding relapse prevention and  self care. 6. Health care follow up as needed for medical problems 7. Restart home medications where appropriate.    Observation Level/Precautions:  15 minute checks  Laboratory:  CBC Chemistry Profile UDS UA Labs reviewed  Psychotherapy:  Individual and group sessions  Medications:  Medications will be started/adjusted as appropriate for patient stabilization  Consultations:  Psychiatry  Discharge Concerns:  Safety, stabilization, and risk of access to medication and medication stabilization   Estimated LOS:  5-7 days  Other:     I certify that inpatient services furnished can reasonably be expected to improve the patient's condition.    Rankin, Shuvon, FNP-BC 10/30/20161:12 PM I have examined the patient and agreed with the findings of H&P and treatment plan. I also have done suicide assessment on this patient.

## 2015-04-05 NOTE — B[facility] Group Notes (Signed)
[facility] Group Notes:  (Clinical Social Work)  04/05/2015  1:30-2:30pm  Summary of Progress/Problems:   The main focus of today's process group was to   1)  discuss the importance of adding supports  2)  define health supports versus unhealthy supports  3)  identify the patient's current unhealthy supports and plan how to handle them  4)  Identify the patient's current healthy supports and plan what to add.  An emphasis was placed on using counselor, doctor, therapy groups, 12-step groups, and problem-specific support groups to expand supports.    The patient expressed full comprehension of the concepts presented, and agreed that there is a need to add more supports.  The patient stated he has good support in his life, but he wishes that he had people in his life that he had similarities with in terms of experiences with mental health.  He gave a lot of feedback throughout group and was appropriate and kind.  Type of Therapy:  Process Group with Motivational Interviewing  Participation Level:  Active  Participation Quality:  Appropriate, Attentive and Supportive  Affect:  Blunted  Cognitive:  Alert, Appropriate and Oriented  Insight:  Engaged  Engagement in Therapy:  Engaged  Modes of Intervention:   Education, Support and Processing, Activity  Ambrose MantleMareida Grossman-Orr, LCSW 04/05/2015

## 2015-04-05 NOTE — BHH Counselor (Signed)
Adult Comprehensive Assessment  Patient ID: Steve BannerRobert Chavarria, male   DOB: 1996/01/15, 19 y.o.   MRN: [id number]  Information Source: Information source: Patient  Current Stressors:  Social relationships: recent breakup with long term girlfriend  Living/Environment/Situation:  Living Arrangements: Non-relatives/Friends Living conditions (as described by patient or guardian): [facility] [employer] [facility].  How long has patient lived in current situation?: 2 months What is atmosphere in current home: Supportive, Comfortable  Family History:  Marital status: Single Does patient have children?: No  Childhood History:  By whom was/is the patient raised?: Both parents Additional childhood history information: Pt reports being raised by both parents.  Pt reports having a good childhood.  Pt reports his parents worked a lot so his sister helped care for him.  Description of patient's relationship with caregiver when they were a child: Pt reports getting along well with parents growing up.  Patient's description of current relationship with people who raised him/her: Pt reports still being close to parents.  Does patient have siblings?: Yes Number of Siblings: 2 Description of patient's current relationship with siblings: pt reports being close to siblings Did patient suffer any verbal/emotional/physical/sexual abuse as a child?: No Did patient suffer from severe childhood neglect?: No Has patient ever been sexually abused/assaulted/raped as an adolescent or adult?: No Was the patient ever a victim of a crime or a disaster?: No Witnessed domestic violence?: No Has patient been effected by domestic violence as an adult?: No  Education:  Highest grade of school patient has completed: 2 years in college Currently a student?: Yes Name of school: [employer] How long has the patient attended?: 2 years, 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 Learning disability?: No  Employment/Work  Situation:   Employment situation: Surveyor, mineralstudent Patient's job has been impacted by current illness: No What is the longest time patient has a held a job?: no work history Has patient ever been in the [employer] and Companymilitary?: No Has patient ever served in Buyer, retailcombat?: No  Financial Resources:   Surveyor, quantityinancial resources: Support from parents / caregiver, Media plannerrivate insurance Does patient have a Lawyerrepresentative payee or guardian?: No  Alcohol/Substance Abuse:   What has been your use of drugs/alcohol within the last 12 months?: pt denies If attempted suicide, did drugs/alcohol play a role in this?: No Alcohol/Substance Abuse Treatment Hx: Denies past history Has alcohol/substance abuse ever caused legal problems?: No  Social Support System:   Patient's Community Support System: Good Describe Community Support System: pt reports parents, siblings and friends are great supports.  Type of faith/religion: none - pt states he's athiest  Leisure/Recreation:   Leisure and Hobbies: movies, TV shows, camping  Strengths/Needs:   What things does the patient do well?: pt states that he's good at analyzing In what areas does patient struggle / problems for patient: depression, SI  Discharge Plan:   Does patient have access to transportation?: Yes Will patient be returning to same living situation after discharge?: Yes Currently receiving community mental health services: Yes (From Whom) [facility]) If no, would patient like referral for services when discharged?: Yes (What county?) [facility]([place] Does patient have financial barriers related to discharge medications?: No  Summary/Recommendations:     Patient is a 19 year old Caucasian Male with a diagnosis of Major Depressive Disorder.  [place] [facility] [place]  Pt reports his girlfriend of 2.5 years broke up with 2 months ago and has been very depressed since.  Pt reports telling a therapist [facility]  that he had passive SI, thinking about a car hitting him to end it all.  Pt states that he voluntarily came to the hospital and doesn't understand why he's involuntary now.  Pt sees Steve Hodge for therapy and Steve Hodge for medication management at [facility].  Pt believes he has an appointment with Steve Hodge this Tuesday, that will need to be rescheduled.  Patient will benefit from crisis stabilization, medication evaluation, group therapy and psycho education in addition to case management for discharge planning. Discharge Process and Patient Expectations information sheet signed by patient, witnessed by writer and inserted in patient's shadow chart.    Pt is not a smoker so [place] Quitline N/A.    Horton, Salome Arnt. 04/05/2015

## 2015-04-05 NOTE — Progress Notes (Signed)
  Psychoeducational Group Note  Date: 04/05/2015 Time:  0930 Group Topic/Focus:  Gratefulness:  The focus of this group is to help patients identify what two things they are most grateful for in their lives. What helps ground them and to center them on their work to their recovery.  Participation Level:  Active  Participation Quality:  Appropriate  Affect:  Appropriate  Cognitive:  Oriented  Insight:  Improving  Engagement in Group:  Engaged  Additional Comments:  Pt was attentive and participated in the group.   Skyeler Scalese A   

## 2015-04-06 NOTE — Progress Notes (Signed)
D: Pt denies SI/HI/AVH. Pt is pleasant and cooperative. Pt   A: Pt was offered support and encouragement. Pt was given scheduled medications. Pt was encourage to attend groups. Q 15 minute checks were done for safety.   R:Pt attends groups and interacts well with peers and staff. Pt is taking medication. Pt has no complaints at this time .Pt receptive to treatment and safety maintained on unit.

## 2015-04-06 NOTE — Progress Notes (Addendum)
D:  Patient's self inventory sheet, patient has fair sleep, sleep medication was helpful.  Good appetite, normal energy level, good concentration.  Has experienced "sore" legs today.  Denied SI.  Physical problems are "small headache and leg soreness".  No pain medication.  Goal is work on discharge plan. Plans to "stay positive"  Denied discharge plans. A:  Medications administered per MD orders.  Emotional support and encouragement given patient. R:  Denied SI and HI, contracts for safety.  Denied A/V hallucinations.  Safety maintained with 15 minute checks.

## 2015-04-06 NOTE — Progress Notes (Signed)
[facility] MD Progress Note  04/06/2015 2:14 PM Pavel Gadd  MRN:  [id number] Subjective:  Patient reports he is feeling better at this time. Denies medication side effects. Objective : I have discussed case with treatment team and have also met with patient . Patient known to me from recent psychiatric evaluation in ED prior to admission to [facility]. 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 Presented to ED with symptoms of severe depression, and had made suicidal statements to his college counselor . Main trigger for worsened mood, depression was recent break up with GF, and finding out that she is moving to [place] in the near future . Patient currently improved compared to initial presentation- less depressed, fuller range of affect, denies any current SI, less intensely ruminative about break up issues . Behavior on unit in good control. Going to groups, visible in milieu. Denies medication side effects. At this time patient denies any lingering suicidal ideations, and is future oriented, hoping for discharge soon, and to return to college . Patient has severe eczema, affecting mostly his hands bilaterally, likely further compounded by compulsive hand washing prior to admission- currently improving.  Principal Problem: MDD (major depressive disorder), single episode, severe , no psychosis ([place]) Diagnosis:   Patient Active Problem List   Diagnosis Date Noted  . MDD (major depressive disorder), single episode, severe , no psychosis ([place]) [F32.2] 04/05/2015  . MDD (major depressive disorder), single episode [F32.9] 04/04/2015   Total Time spent with patient: 20 minutes    Past Medical History:  Past Medical History  Diagnosis Date  . Eczema   . HSV-1 infection    History reviewed. No pertinent past surgical history. Family History: History reviewed. No pertinent family history.  Social History:  History  Alcohol Use No     History  Drug Use No    Social  History   Social History  . Marital Status: Single    Spouse Name: N/A  . Number of Children: N/A  . Years of Education: N/A   Social History Main Topics  . Smoking status: Never Smoker   . Smokeless tobacco: None  . Alcohol Use: No  . Drug Use: No  . Sexual Activity: Not Asked   Other Topics Concern  . None   Social History Narrative   Additional Social History:    Pain Medications: none Prescriptions: none Over the Counter: none History of alcohol / drug use?: No history of alcohol / drug abuse  Sleep:  Improved   Appetite:   Improved   Current Medications: Current Facility-Administered Medications  Medication Dose Route Frequency Provider Last Rate Last Dose  . acetaminophen (TYLENOL) tablet 650 mg  650 mg Oral Q4H PRN Lurena Nida, NP      . citalopram (CELEXA) tablet 20 mg  20 mg Oral Daily Lurena Nida, NP   20 mg at 04/06/15 0816  . hydrocerin (EUCERIN) cream   Topical BID Jenne Campus, MD      . hydrOXYzine (ATARAX/VISTARIL) tablet 25 mg  25 mg Oral Q6H PRN Lurena Nida, NP   25 mg at 04/05/15 2321  . ibuprofen (ADVIL,MOTRIN) tablet 600 mg  600 mg Oral Q8H PRN Lurena Nida, NP      . ondansetron [facility]) tablet 4 mg  4 mg Oral Q8H PRN Lurena Nida, NP      . triamcinolone (KENALOG) 0.025 % cream   Topical BID Jenne Campus, MD  Lab Results: No results found for this or any previous visit (from the past 48 hour(s)).  Physical Findings: AIMS: Facial and Oral Movements Muscles of Facial Expression: None, normal Lips and Perioral Area: None, normal Jaw: None, normal Tongue: None, normal,Extremity Movements Upper (arms, wrists, hands, fingers): None, normal Lower (legs, knees, ankles, toes): None, normal, Trunk Movements Neck, shoulders, hips: None, normal, Overall Severity Severity of abnormal movements (highest score from questions above): None, normal Incapacitation due to abnormal movements: None, normal Patient's awareness of abnormal  movements (rate only patient's report): No Awareness, Dental Status Current problems with teeth and/or dentures?: No Does patient usually wear dentures?: No  CIWA:  CIWA-Ar Total: 1 COWS:  COWS Total Score: 1  Musculoskeletal: Strength & Muscle Tone: within normal limits Gait & Station: normal Patient leans: N/A  Psychiatric Specialty Exam: ROS denies headache, denies chest pain, denies shortness of breath, denies nausea or vomiting   Blood pressure 118/66, pulse 75, temperature 97.3 F (36.3 C), temperature source Oral, resp. rate 16, height _0  (1.854 m), weight 143 lb (64.864 kg).Body mass index is 18.87 kg/(m^2).  General Appearance: improved grooming   Eye Contact::  Good  Speech:  Normal Rate  Volume:  Normal  Mood:  improved, less depressed, at this time minimizes depression  Affect:  Appropriate  Thought Process:  Linear  Orientation:  Full (Time, Place, and Person)  Thought Content:  denies hallucinations, no delusions, not internally preoccupied   Suicidal Thoughts:  No denies any current suicidal or homicidal ideations   Homicidal Thoughts:  No  Memory:  recent and remote grossly intact   Judgement:  Other:  improved   Insight:  improving   Psychomotor Activity:  Normal  Concentration:  Good  Recall:  Good  Fund of Knowledge:Good  Language: Good  Akathisia:  Negative  Handed:  Right  AIMS (if indicated):     Assets:  Communication Skills Desire for Improvement Physical Health Resilience Vocational/Educational  ADL's:  Intact  Cognition: WNL  Sleep:  Number of Hours: 5.5  Assessment - at this time patient improving compared to initial presentation. Mood improved, less depressed, and affect much more reactive and brighter. Denies any suicidal ideations and behavior on unit calm and in less control. Seems less focused and less ruminative about recent break up . Denies medication side effects at this time . Treatment Plan Summary: Daily contact with patient to  assess and evaluate symptoms and progress in treatment, Medication management, Plan inpatient admission and medications as below  Continue CELEXA 20 mgrs QDAY for depression and anxiety- we have also reviewed medication side effects, to include potential increased agitation or SI early in treatment with SSRIs in young adults . Continue VISTARIL 25 mgrs Q 6 hours PRN for anxiety as needed  EUCERIN AND KENALOG Topically for Eczema  Continue to encourage milieu, group participation to work on coping skills, symptom reduction. Treatment team working on disposition planning . COBOS, [place] 04/06/2015, 2:14 PM

## 2015-04-06 NOTE — Plan of Care (Signed)
Problem: Alteration in mood Goal: LTG-Patient reports reduction in suicidal thoughts (Patient reports reduction in suicidal thoughts and is able to verbalize a safety plan for whenever patient is feeling suicidal)  Outcome: Progressing Pt denies SI at this time     

## 2015-04-06 NOTE — Plan of Care (Signed)
Problem: Ineffective individual coping Goal: LTG: Patient will report a decrease in negative feelings Outcome: Progressing Nurse discussed depression/coping skills with patient.        

## 2015-04-06 NOTE — Plan of Care (Signed)
Problem: Ineffective individual coping Goal: STG: Patient will remain free from self harm Outcome: Progressing Pt safe on the unit     

## 2015-04-06 NOTE — Progress Notes (Signed)
D: Pt denies SI/HI/AVH. Pt is pleasant and cooperative. Pt stated he felt much better, he was going to groups and he felt he was getting better.   A: Pt was offered support and encouragement. Pt was given scheduled medications. Pt was encourage to attend groups. Q 15 minute checks were done for safety.    R:Pt attends groups and interacts well with peers and staff. Pt is taking medication. Pt has no complaints.Pt receptive to treatment and safety maintained on unit.

## 2015-04-06 NOTE — Progress Notes (Signed)
Adult Psychoeducational Group Note  Date:  04/06/2015 Time:  9:57 PM  Group Topic/Focus:  Wrap-Up Group:   The focus of this group is to help patients review their daily goal of treatment and discuss progress on daily workbooks.  Participation Level:  Active  Participation Quality:  Appropriate and Attentive  Affect:  Appropriate  Cognitive:  Appropriate  Insight: Appropriate and Good  Engagement in Group:  Engaged  Modes of Intervention:  Education  Additional Comments:  Pt overall had a good day. Pt was feeling down in the morning but his peers was able to cheer him up. Pt gets anxious when thinking about his relationship goal.   Merlinda FrederickKeshia S Nalaysia Manganiello 04/06/2015, 9:57 PM

## 2015-04-06 NOTE — [facility] Group Notes (Signed)
[facility]  04/06/2015 8:45 AM  Participation Quality: Alert, Appropriate and Oriented  Mood/Affect: Flat  Depression Rating: 3  Anxiety Rating: 3  Thoughts of Suicide: Pt denies SI/HI  Will you contract for safety? Yes  Current AVH: Pt denies  Plan for Discharge/Comments: Pt attended discharge planning group and actively participated in group. CSW discussed suicide prevention education with the group and encouraged them to discuss discharge planning and any relevant barriers. Pt was appropriate in group, maintains minimal eye contact. He reports feeling better and that his main focus is to return to school, talk to his professors, and establish a plan.  Transportation Means: Pt reports access to transportation  Supports: No supports mentioned at this time  Chad CordialLauren Carter, LCSWA 04/06/2015 9:53 AM

## 2015-04-06 NOTE — [facility] Group Notes (Signed)
[facility] LCSW Group Therapy  04/06/2015 1:15pm  Type of Therapy:  Group Therapy vercoming Obstacles  Participation Level:  Active  Participation Quality:  Appropriate   Affect:  Appropriate  Cognitive:  Appropriate and Oriented  Insight:  Developing/Improving and Improving  Engagement in Therapy:  Improving  Modes of Intervention:  Discussion, Exploration, Problem-solving and Support  Description of Group:   In this group patients will be encouraged to explore what they see as obstacles to their own wellness and recovery. They will be guided to discuss their thoughts, feelings, and behaviors related to these obstacles. The group will process together ways to cope with barriers, with attention given to specific choices patients can make. Each patient will be challenged to identify changes they are motivated to make in order to overcome their obstacles. This group will be process-oriented, with patients participating in exploration of their own experiences as well as giving and receiving support and challenge from other group members.  Summary of Patient Progress: Pt participated actively in group discussion and expressed that his procrastination is often an obstacle for him that increases his stress levels. He also described an internal conflict that affects his motivation levels, reporting that he often wonders if what he is striving for will ultimately make him happy. Pt endorsed a desire to be more active at his campus and create a solid plan for coping with his struggle to feel like he "belongs" and has purpose.   Therapeutic Modalities:   Cognitive Behavioral Therapy Solution Focused Therapy Motivational Interviewing Relapse Prevention Therapy   Chad CordialLauren Carter, LCSWA 04/06/2015 3:12 PM

## 2015-04-07 NOTE — Plan of Care (Signed)
Problem: Ineffective individual coping Goal: LTG: Patient will report a decrease in negative feelings Outcome: Progressing Stated he felt much better today Goal: STG:Pt. will utilize relaxation techniques to reduce stress STG: Patient will utilize relaxation techniques to reduce stress levels  Outcome: Progressing Pt stated he went to his room and did deep breathing and focused on other things when he started to stress today

## 2015-04-07 NOTE — Progress Notes (Signed)
D:  Patient's self inventory revealed that his sleep was fair, appetite was good, energy level was normal and concentration today was good.  Patient rated his depression a "2" and his anxiety a "1"  Patient was calm and cooperative on the unit this shift.  Patient did not attend morning group as he was still in bed, however, did attend afternoon group.  Patient denies suicidal ideation, homicidal ideation, auditory or visual hallucinations at the current time. A:  Scheduled medication was administered to patient per MD orders.  Emotional support and encouragement were provided.  Patient was maintained on q.15 minute safety checks.  Patient was informed to notify staff with any questions or concerns.  R:  No adverse reactions to medications were noted.  Patient was cooperative with medication administration and treatment plan this shift.  Patient was receptive, calm and cooperative on the unit this shift.  Patient interacts well with others on the unit.  Patient contracts for safety at this time.  Patient remains safe at this time.

## 2015-04-07 NOTE — Progress Notes (Signed)
D: Pt denies SI/HI/AVH. Pt is pleasant and cooperative. Pt stated he was feeling much better, he was able to identify certain triggers and able to cope better when agitated and irritated.   A: Pt was offered support and encouragement. Pt was given scheduled medications. Pt was encourage to attend groups. Q 15 minute checks were done for safety.   R:Pt attends groups and interacts well with peers and staff. Pt is taking medication. Pt has no complaints at this time .Pt receptive to treatment and safety maintained on unit.

## 2015-04-07 NOTE — [facility] Group Notes (Signed)
The focus of this group is to educate the patient on the purpose and policies of crisis stabilization and provide a format to answer questions about their admission.  The group details unit policies and expectations of patients while admitted.  Patient did not attend 0900 nurse education orientation group this morning.  Patient stayed in bed.   

## 2015-04-07 NOTE — Tx Team (Signed)
Interdisciplinary Treatment Plan Update (Adult) Date: 04/07/2015   Date: 04/07/2015 4:07 PM  Progress in Treatment:  Attending groups: Yes  Participating in groups: Yes  Taking medication as prescribed: Yes  Tolerating medication: Yes  Family/Significant othe contact made: Yes, with father Patient understands diagnosis: Yes Discussing patient identified problems/goals with staff: Yes  Medical problems stabilized or resolved: Yes  Denies suicidal/homicidal ideation: Yes Patient has not harmed self or Others: Yes   New problem(s) identified: None identified at this time.   Discharge Plan or Barriers: Pt will return to his dorm room and [facility]  Additional comments: n/a   Reason for Continuation of Hospitalization:  Anxiety Depression Medication stabilization Suicidal ideation  Estimated length of stay: 1 day  Review of initial/current patient goals per problem list:   1.  Goal(s): Patient will participate in aftercare plan  Met:  Yes  Target date: 3-5 days from date of admission   As evidenced by: Patient will participate within aftercare plan AEB aftercare provider and housing plan at discharge being identified.   04/07/15: Pt will return to his dorm room and follow-up with [facility]  2.  Goal (s): Patient will exhibit decreased depressive symptoms and suicidal ideations.  Met:  Yes  Target date: 3-5 days from date of admission   As evidenced by: Patient will utilize self rating of depression at 3 or below and demonstrate decreased signs of depression or be deemed stable for discharge by MD.  04/07/15: Pt rates depression at 2/10; denies SI  3.  Goal(s): Patient will demonstrate decreased signs and symptoms of anxiety.  Met:  Yes  Target date: 3-5 days from date of admission   As evidenced by: Patient will utilize self rating of anxiety at 3 or below and demonstrated decreased signs of anxiety, or be deemed  stable for discharge by MD  04/07/15: Pt rates anxiety at 1/10; interacts well with peers on the unit.  Attendees:  Patient:    Family:    Physician: Dr. Parke Poisson, MD  04/07/2015 4:07 PM  Nursing: Lars Pinks, RN Case manager  04/07/2015 4:07 PM  Clinical Social Worker Norman Clay, MSW 04/07/2015 4:07 PM  Other: Lucinda Dell, Beverly Sessions Liasion 04/07/2015 4:07 PM  Clinical: Grayland Ormond RN 04/07/2015 4:07 PM  Other: , RN Charge Nurse 04/07/2015 4:07 PM  Other:     Peri Maris, Latanya Presser MSW

## 2015-04-07 NOTE — [facility] Suicide Risk Assessment (Signed)
[facility] INPATIENT:  Family/Significant Other Suicide Prevention Education  Suicide Prevention Education:  Education Completed; Park Breednthony Celluci, Pt's father [phone], has been identified by the patient as the family member/significant other with whom the patient will be residing, and identified as the person(s) who will aid the patient in the event of a mental health crisis (suicidal ideations/suicide attempt).  With written consent from the patient, the family member/significant other has been provided the following suicide prevention education, prior to the and/or following the discharge of the patient.  The suicide prevention education provided includes the following:  Suicide risk factors  Suicide prevention and interventions  National Suicide Hotline telephone number  [facility] assessment telephone number  [facility] Emergency Assistance 911  [facility] and/or Residential Mobile Crisis Unit telephone number  Request made of family/significant other to:  Remove weapons (e.g., guns, rifles, knives), all items previously/currently identified as safety concern.    Remove drugs/medications (over-the-counter, prescriptions, illicit drugs), all items previously/currently identified as a safety concern.  The family member/significant other verbalizes understanding of the suicide prevention education information provided.  The family member/significant other agrees to remove the items of safety concern listed above.  Elaina Hoopsarter, [place]illa Coffel M 04/07/2015, 11:47 AM

## 2015-04-07 NOTE — [facility] Group Notes (Signed)
Adult Psychoeducational Group Note  Date:  04/07/2015 Time:  8:57 PM  Group Topic/Focus:  Wrap-Up Group:   The focus of this group is to help patients review their daily goal of treatment and discuss progress on daily workbooks.  Participation Level:  Active  Participation Quality:  Appropriate and Attentive  Affect:  Appropriate  Cognitive:  Alert and Appropriate  Insight: Appropriate  Engagement in Group:  Engaged  Modes of Intervention:  Discussion and Education  Additional Comments:  Pt stated his day was ok  Shelly BombardGarner, Sheldon Amara D 04/07/2015, 8:57 PM

## 2015-04-07 NOTE — [facility] Group Notes (Signed)
[facility] LCSW Group Therapy 04/07/2015 1:15 PM  Type of Therapy: Group Therapy- Feelings about Diagnosis  Participation Level: Minimal  Participation Quality:  Reserved  Affect:  Appropriate  Cognitive: Alert and Oriented   Insight:  Developing   Engagement in Therapy: Developing/Improving and Engaged   Modes of Intervention: Clarification, Confrontation, Discussion, Education, Exploration, Limit-setting, Orientation, Problem-solving, Rapport Building, Dance movement psychotherapisteality Testing, Socialization and Support  Description of Group:   This group will allow patients to explore their thoughts and feelings about diagnoses they have received. Patients will be guided to explore their level of understanding and acceptance of these diagnoses. Facilitator will encourage patients to process their thoughts and feelings about the reactions of others to their diagnosis, and will guide patients in identifying ways to discuss their diagnosis with significant others in their lives. This group will be process-oriented, with patients participating in exploration of their own experiences as well as giving and receiving support and challenge from other group members.  Summary of Progress/Problems:  Pt was more reserved in group today, only participating once. Pt reports that his family often asks why he cannot just "get through it" which makes him wonder that about himself as well.   Therapeutic Modalities:   Cognitive Behavioral Therapy Solution Focused Therapy Motivational Interviewing Relapse Prevention Therapy  Chad CordialLauren Carter, LCSWA 04/07/2015 4:06 PM

## 2015-04-07 NOTE — Progress Notes (Signed)
Recreation Therapy Notes  Animal-Assisted Activity (AAA) Program Checklist/Progress Notes Patient Eligibility Criteria Checklist & Daily Group note for Rec Tx Intervention  Date: 11.01.2016 Time: 2:15pm Location: [address] Dayroom   AAA/T Program Assumption of Risk Form signed by Patient/ or Parent Legal Guardian yes  Patient is free of allergies or sever asthma yes  Patient reports no fear of animals yes  Patient reports no history of cruelty to animals yes  Patient understands his/her participation is voluntary yes  Behavioral Response: Did not attend.   Delonna Ney L Carlito Bogert, LRT/CTRS       Renee Beale L 04/07/2015 2:32 PM 

## 2015-04-07 NOTE — Progress Notes (Signed)
Patient ID: Steve Hodge, male   DOB: [birth year], 19 y.o.   MRN: [id number] [facility] MD Progress Note  04/07/2015 5:43 PM Steve Hodge  MRN:  [id number] Subjective:   Patient reports ongoing improvement , and denies medication side effects.  Objective : I have discussed case with treatment team and have also met with patient . At present patient is improved comapred to admission presenatiotn. Presents with improved mood, improved range of affect,and currently minimizes depression. Does not endorse anhedonia , and states appetite, sleep are improved . He presents less focused and less ruminative about recent relationship break up. He is future oriented and hoping for discharge soon in order to resume college classes. Denies medication side effects. With  his express consent and in his presence I spoke with his father via phone, who corroborates that patient seems much improved and who agrees with patient being discharged soon if he continues to stabilize. Of note, father has expressed concern that patient may have an element of OCD in addition to depression, due to tendency to worry , ruminate, and patient does endorse some compulsions such as checking/rechecking, but states these do not significantly impair his daily activities at this time. We reviewed medication issues, and how current SSRI management may help not only depressive symptoms but also anxiety symptoms. Patient calm, pleasant on unit, going to groups ,interacting with peers . Principal Problem: MDD (major depressive disorder), single episode, severe , no psychosis ([place]) Diagnosis:   Patient Active Problem List   Diagnosis Date Noted  . MDD (major depressive disorder), single episode, severe , no psychosis ([place]) [F32.2] 04/05/2015  . MDD (major depressive disorder), single episode [F32.9] 04/04/2015   Total Time spent with patient: 20 minutes    Past Medical History:  Past Medical History  Diagnosis Date  . Eczema   . HSV-1  infection    History reviewed. No pertinent past surgical history. Family History: History reviewed. No pertinent family history.  Social History:  History  Alcohol Use No     History  Drug Use No    Social History   Social History  . Marital Status: Single    Spouse Name: N/A  . Number of Children: N/A  . Years of Education: N/A   Social History Main Topics  . Smoking status: Never Smoker   . Smokeless tobacco: None  . Alcohol Use: No  . Drug Use: No  . Sexual Activity: Not Asked   Other Topics Concern  . None   Social History Narrative   Additional Social History:    Pain Medications: none Prescriptions: none Over the Counter: none History of alcohol / drug use?: No history of alcohol / drug abuse  Sleep:  Improved   Appetite:   Improved   Current Medications: Current Facility-Administered Medications  Medication Dose Route Frequency Provider Last Rate Last Dose  . acetaminophen (TYLENOL) tablet 650 mg  650 mg Oral Q4H PRN Lurena Nida, NP      . citalopram (CELEXA) tablet 20 mg  20 mg Oral Daily Lurena Nida, NP   20 mg at 04/07/15 0840  . hydrocerin (EUCERIN) cream   Topical BID Jenne Campus, MD      . hydrOXYzine (ATARAX/VISTARIL) tablet 25 mg  25 mg Oral Q6H PRN Lurena Nida, NP   25 mg at 04/06/15 2253  . ibuprofen (ADVIL,MOTRIN) tablet 600 mg  600 mg Oral Q8H PRN Lurena Nida, NP      . ondansetron [facility]) tablet  4 mg  4 mg Oral Q8H PRN Lurena Nida, NP      . triamcinolone (KENALOG) 0.025 % cream   Topical BID Jenne Campus, MD        Lab Results: No results found for this or any previous visit (from the past 48 hour(s)).  Physical Findings: AIMS: Facial and Oral Movements Muscles of Facial Expression: None, normal Lips and Perioral Area: None, normal Jaw: None, normal Tongue: None, normal,Extremity Movements Upper (arms, wrists, hands, fingers): None, normal Lower (legs, knees, ankles, toes): None, normal, Trunk Movements Neck,  shoulders, hips: None, normal, Overall Severity Severity of abnormal movements (highest score from questions above): None, normal Incapacitation due to abnormal movements: None, normal Patient's awareness of abnormal movements (rate only patient's report): No Awareness, Dental Status Current problems with teeth and/or dentures?: No Does patient usually wear dentures?: No  CIWA:  CIWA-Ar Total: 1 COWS:  COWS Total Score: 1  Musculoskeletal: Strength & Muscle Tone: within normal limits Gait & Station: normal Patient leans: N/A  Psychiatric Specialty Exam: ROS denies headache, denies chest pain, denies shortness of breath, denies nausea or vomiting   Blood pressure 118/54, pulse 97, temperature 97.6 F (36.4 C), temperature source Oral, resp. rate 16, height 6' 1"  (1.854 m), weight 143 lb (64.864 kg).Body mass index is 18.87 kg/(m^2).  General Appearance: improved grooming   Eye Contact::  Good  Speech:  Normal Rate   at this time reports improved mood, minimizes depression    Affect:  Appropriate, fuller in range , less anxious   Thought Process:  Linear  Orientation:  Full (Time, Place, and Person)  Thought Content:  denies hallucinations, no delusions, not internally preoccupied   Suicidal Thoughts:  No denies any current suicidal or homicidal ideations   Homicidal Thoughts:  No  Memory:  recent and remote grossly intact   Judgement:  Other:  improved   Insight:  improving   Psychomotor Activity:  Normal  Concentration:  Good  Recall:  Good  Fund of Knowledge:Good  Language: Good  Akathisia:  Negative  Handed:  Right  AIMS (if indicated):     Assets:  Communication Skills Desire for Improvement Physical Health Resilience Vocational/Educational  ADL's:  Intact  Cognition: WNL  Sleep:  Number of Hours: 5  Assessment -  Mood and affect present improved- less focused on stressors, recent termination of relationship, and more focused on returning to his classes, academic  goals . Marland Kitchen Denies medication side effects at this time . Behavior on unit calm and in good control.  Treatment Plan Summary: Daily contact with patient to assess and evaluate symptoms and progress in treatment, Medication management, Plan inpatient admission and medications as below  Continue CELEXA 20 mgrs QDAY for depression and anxiety Consider discharge soon as patient continues to improve /stabilize  Continue VISTARIL 25 mgrs Q 6 hours PRN for anxiety as needed  EUCERIN AND KENALOG Topically for Eczema  Continue to encourage milieu, group participation to work on coping skills, symptom reduction. Treatment team working on disposition planning . COBOS, [place] 04/07/2015, 5:43 PM

## 2015-04-08 MED ORDER — HYDROCERIN EX CREA: 1.0000 "application " | TOPICAL_CREAM | Freq: Two times a day (BID) | CUTANEOUS | Status: DC

## 2015-04-08 MED ORDER — TRIAMCINOLONE ACETONIDE 0.025 % EX CREA: TOPICAL_CREAM | Freq: Two times a day (BID) | CUTANEOUS | Status: DC

## 2015-04-08 MED ORDER — CITALOPRAM HYDROBROMIDE 20 MG PO TABS: 20.0000 mg | ORAL_TABLET | Freq: Every day | ORAL | Status: DC

## 2015-04-08 MED ORDER — HYDROXYZINE HCL 25 MG PO TABS: 25.0000 mg | ORAL_TABLET | Freq: Four times a day (QID) | ORAL | Status: DC | PRN

## 2015-04-08 NOTE — Discharge Summary (Signed)
Physician Discharge Summary Note  Patient:  Steve Hodge is an 19 y.o., male MRN:  [id number] DOB:  [birth year] Patient phone:  [phone] (home)  Patient address:   [address] [place] [address],  Total Time spent with patient: 45 minutes  Date of Admission:  04/04/2015 Date of Discharge: 04/08/2015  Reason for Admission:   History of Present Illness:: Patient states "I go to counseling at [facility] and I've been having a hard time lately. I went to counseling cause I felt the need to talk to somebody so I went to one of the walking sessions; and they asked me a lot of questions like are you feeling suicidal. I was like when I'm waking down the street sometime I think if someone hits me it's not a big deal. I never took the thoughts seriously; never thought I would do anything like that; figured I was just feeling really down and low at the time." Patient states that he recently broke up with his girlfriend that he had been dating for 2 1/2 years. "I really thought that it was it for me; I had my future plan for my family; I thought we would be together forever, until she told me that she did not feel the same way anymore and that she was moving to [place]. She doesn't talk to me anymore. I wrote her a letter to get some closure and just to let her know that I was here for here if she needed me; when I didn't get a response back I called her she said she had gotten the letter but didn't really have anything to say. It just hurt so much to have someone walk away when you you've done nothing wrong or you don't know why that person doesn't love you any more."  Patient states that he has had some passive suicidal thoughts but doesn't feel that he would do anything to hurt himself. States that he has no history of prior suicide attempt or self injurious behavior. Outpatient services with [facility] counseling (susan) and going to start medication management with Deon Pilling NP). Denies  prior inpatient hospitalization. Patient states that his parents are very supportive and lives in [place], [place]; he has a brother in [place] and a sister in [place] whom are also supportive and have been calling to talk with him. He is living with two other guys in an apartment who are his friends and supportive also. 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 Denies any issues with self esteem, or social anxiety. States that he had depression once before when in highschool but in general he is usually a happy person without any worries. "This break up for no reason is what got me so down."  At this time patient is denying suicidal ideation and is able to contract for safety. States that he really wanted to get started on medication and continue with his outpatient services. States that he does not want to miss to many days in school or he will start failing and "don't need that as a stressor."   Principal Problem: MDD (major depressive disorder), single episode, severe , no psychosis ([facility]) Discharge Diagnoses: Patient Active Problem List   Diagnosis Date Noted  . MDD (major depressive disorder), single episode, severe , no psychosis ([facility]) [F32.2] 04/05/2015  . MDD (major depressive disorder), single episode [F32.9] 04/04/2015    Musculoskeletal: Strength & Muscle Tone:  within normal limits Gait & Station: normal Patient leans: N/A  Psychiatric Specialty Exam: Physical Exam  Review of Systems  Psychiatric/Behavioral: Positive for depression. Negative for suicidal ideas and hallucinations. The patient is nervous/anxious and has insomnia.   All other systems reviewed and are negative.   Blood pressure 125/65, pulse 84, temperature 97.7 F (36.5 C), temperature source Oral, resp. rate 18, height  (1.854 m), weight 64.864 kg (143 lb).Body mass index is 18.87 kg/(m^2).  SEE MD PSE within the SRA   Have you used any form of  tobacco in the last 30 days? (Cigarettes, Smokeless Tobacco, Cigars, and/or Pipes): Patient Refused Screening  Has this patient used any form of tobacco in the last 30 days? (Cigarettes, Smokeless Tobacco, Cigars, and/or Pipes) No  Past Medical History:  Past Medical History  Diagnosis Date  . Eczema   . HSV-1 infection    History reviewed. No pertinent past surgical history. Family History: History reviewed. No pertinent family history. Social History:  History  Alcohol Use No     History  Drug Use No    Social History   Social History  . Marital Status: Single    Spouse Name: N/A  . Number of Children: N/A  . Years of Education: N/A   Social History Main Topics  . Smoking status: Never Smoker   . Smokeless tobacco: None  . Alcohol Use: No  . Drug Use: No  . Sexual Activity: Not Asked   Other Topics Concern  . None   Social History Narrative    Risk to Self: Is patient at risk for suicide?: Yes What has been your use of drugs/alcohol within the last 12 months?: pt denies Risk to Others:   Prior Inpatient Therapy:   Prior Outpatient Therapy:    Level of Care:  OP  Hospital Course:   Lou Loewe was admitted for MDD (major depressive disorder), single episode, severe , no psychosis (HCC) , and crisis management.  Pt was treated discharged with the medications listed below under Medication List.  Medical problems were identified and treated as needed.  Home medications were restarted as appropriate.  Improvement was monitored by observation and [name] 's daily report of symptom reduction.  Emotional and mental status was monitored by daily self-inventory reports completed by [name] and clinical staff.         [name] was evaluated by the treatment team for stability and plans for continued recovery upon discharge. [name] 's motivation was an integral factor for scheduling further treatment. Employment, transportation, bed  availability, health status, family support, and any pending legal issues were also considered during hospital stay. Pt was offered further treatment options upon discharge including but not limited to Residential, Intensive Outpatient, and Outpatient treatment.  [name] will follow up with the services as listed below under Follow Up Information.     Upon completion of this admission the patient was both mentally and medically stable for discharge denying suicidal/homicidal ideation, auditory/visual/tactile hallucinations, delusional thoughts and paranoia.    Consults:  None  Significant Diagnostic Studies:  UDS negative, all else unremarkable  Discharge Vitals:   Blood pressure 125/65, pulse 84, temperature 97.7 F (36.5 C), temperature source Oral, resp. rate 18, height  (1.854 m), weight 64.864 kg (143 lb). Body mass index is 18.87 kg/(m^2). Lab Results:   No results found for this or any previous visit (from the past 72 hour(s)).  Physical Findings: AIMS: Facial and Oral Movements Muscles  of Facial Expression: None, normal Lips and Perioral Area: None, normal Jaw: None, normal Tongue: None, normal,Extremity Movements Upper (arms, wrists, hands, fingers): None, normal Lower (legs, knees, ankles, toes): None, normal, Trunk Movements Neck, shoulders, hips: None, normal, Overall Severity Severity of abnormal movements (highest score from questions above): None, normal Incapacitation due to abnormal movements: None, normal Patient's awareness of abnormal movements (rate only patient's report): No Awareness, Dental Status Current problems with teeth and/or dentures?: No Does patient usually wear dentures?: No  CIWA:  CIWA-Ar Total: 1 COWS:  COWS Total Score: 1   See Psychiatric Specialty Exam and Suicide Risk Assessment completed by Attending Physician prior to discharge.  Discharge destination:  Home  Is patient on multiple antipsychotic therapies at discharge:  No    Has Patient had three or more failed trials of antipsychotic monotherapy by history:  No    Recommended Plan for Multiple Antipsychotic Therapies: NA     Medication List    STOP taking these medications        valACYclovir 500 MG tablet  Commonly known as:  VALTREX      TAKE these medications      Indication   citalopram 20 MG tablet  Commonly known as:  CELEXA  Take 1 tablet (20 mg total) by mouth daily.   Indication:  Depression     hydrocerin Crea  Apply 1 application topically 2 (two) times daily.   Indication:  dry skin     hydrOXYzine 25 MG tablet  Commonly known as:  ATARAX/VISTARIL  Take 1 tablet (25 mg total) by mouth every 6 (six) hours as needed for itching or anxiety.   Indication:  Anxiety Neurosis     triamcinolone 0.025 % cream  Commonly known as:  KENALOG  Apply topically 2 (two) times daily.   Indication:  dermatitis           Follow-up Information    [facility]2015.   Why:  at 9:00am with Darl PikesSusan for therapy.   Contact information:   Marlana Salvagenna M. [facility] [address] [place], [place] [address] VOICE [phone] Valinda HoarFAX [phone]      [facility] On 05/04/2015.   Why:  at 11:00am for medication management.   Contact information:   Marlana Salvagenna M. [facility] [address] [place], [place] [address] VOICE [phone] FAX [phone]      Follow-up recommendations:  Activity:  As tolerated Diet:  Heart healthy with low sodium.  Comments:   Take all medications as prescribed. Keep all follow-up appointments as scheduled.  Do not consume alcohol or use illegal drugs while on prescription medications. Report any adverse effects from your medications to your primary care provider promptly.  In the event of recurrent symptoms or worsening symptoms, call 911, a crisis hotline, or go to the nearest emergency department for evaluation.   Total Discharge Time:   Greater than 30 minutes  Signed: Beau FannyWithrow, John C, FNP-BC 04/08/2015, 10:17 AM   Patient seen, Suicide Assessment Completed.  Disposition Plan Reviewed

## 2015-04-08 NOTE — [facility] Suicide Risk Assessment (Signed)
[facility]   Demographic Factors:  19 year old single male, college student , lives in dorm  Total Time spent with patient: 30 minutes  Musculoskeletal: Strength & Muscle Tone: within normal limits Gait & Station: normal Patient leans: N/A  Psychiatric Specialty Exam: Physical Exam  ROS  Blood pressure 125/65, pulse 84, temperature 97.7 F (36.5 C), temperature source Oral, resp. rate 18, height 6\' 1"  (1.854 m), weight 143 lb (64.864 kg).Body mass index is 18.87 kg/(m^2).  General Appearance: Well Groomed  Eye Contact::  Good  Speech:  Normal Rate409  Volume:  Normal  Mood:  improved, less depressed, at this time denies feeling depressed   Affect:  more reactive, less constricted, smiling often and appropriately  Thought Process:  Linear  Orientation:  Full (Time, Place, and Person)  Thought Content:  no hallucinations, no delusions   Suicidal Thoughts:  No denies any self injurious ideations, denies any suicidal ideations  Homicidal Thoughts:  No  Memory:  recent and remote grossly intact   Judgement:  Other:  improved   Insight:  improved   Psychomotor Activity:  Normal  Concentration:  Good  Recall:  Good  Fund of Knowledge:Good  Language: Good  Akathisia:  NA  Handed:  Right  AIMS (if indicated):     Assets:  Communication Skills Desire for Improvement Resilience Social Support  Sleep:  Number of Hours: 5  Cognition: WNL  ADL's:  Intact   Have you used any form of tobacco in the last 30 days? (Cigarettes, Smokeless Tobacco, Cigars, and/or Pipes): Patient Refused Screening  Has this patient used any form of tobacco in the last 30 days? (Cigarettes, Smokeless Tobacco, Cigars, and/or Pipes) No  Mental Status Per Nursing Assessment::   On Admission:  Suicidal ideation indicated by patient  Current Mental Status by Physician: At this time patient is much improved compared to admission. Mood is significantly improved and at this time  denies feeling depressed, affect has been more reactive, less constricted, no thought disorder, no Suicidal ideations, no homicidal ideations, no hallucinations,  No delusions, oriented x 3.   Loss Factors: recent break up with GF  Historical Factors: no prior suicide attempts, no history of psychosis, no history of drug, alcohol abuse   Risk Reduction Factors:   Sense of responsibility to family, Living with another person, especially a relative and Positive coping skills or problem solving skills  Continued Clinical Symptoms:   as noted, at this time patient is improved- less depressed, affect more reactive, improved, no SI or HI, no psychotic symptoms, future oriented   Cognitive Features That Contribute To Risk:  No gross cognitive deficits noted upon discharge. Is alert , attentive, and oriented x 3   Suicide Risk:  Mild:  Suicidal ideation of limited frequency, intensity, duration, and specificity.  There are no identifiable plans, no associated intent, mild dysphoria and related symptoms, good self-control (both objective and subjective assessment), few other risk factors, and identifiable protective factors, including available and accessible social support.  Principal Problem: MDD (major depressive disorder), single episode, severe , no psychosis (HCC) Discharge Diagnoses:  Patient Active Problem List   Diagnosis Date Noted  . MDD (major depressive disorder), single episode, severe , no psychosis (HCC) [F32.2] 04/05/2015  . MDD (major depressive disorder), single episode [F32.9] 04/04/2015    Follow-up Information    [facility] On 04/14/2015.   Why:  at 9:00am with Darl PikesSusan for therapy.   Contact information:  Marlana Salvage. [facility] [address] [place], [place] [address] VOICE [phone] Valinda Hoar [phone]      Follow up with [facility] On 05/04/2015.   Why:  at 11:00am for medication management.   Contact information:    Marlana Salvage. [facility] [address] [place], [place] [address] VOICE [phone] FAX [phone]      Plan Of Care/Follow-up recommendations:  Activity:  as tolerated  Diet:  Regular  Tests:  NA Other:  follow up as above   Is patient on multiple antipsychotic therapies at discharge:  No   Has Patient had three or more failed trials of antipsychotic monotherapy by history:  No  Recommended Plan for Multiple Antipsychotic Therapies: NA  Patient is leaving in good spirits. Plans to return to college dorm, and return to classes soon. Plans to follow up as above .   Malya Cirillo 04/08/2015, 1:48 PM

## 2015-04-08 NOTE — Progress Notes (Signed)
  [facility] :  Will you be returning to the same living situation after discharge:  Yes,  Pt returning to his dorm room At discharge, do you have transportation home?: Yes,  friend to provide transportation Do you have the ability to pay for your medications: Yes,  Pt provided with prescriptions  Release of information consent forms completed and in the chart;  Patient's signature needed at discharge.  Patient to Follow up at: Follow-up Information    [facility] On 04/14/2015.   Why:  at 9:00am with Darl PikesSusan for therapy.   Contact information:   Marlana Salvagenna M. [facility] [address] [place], [place] [address] VOICE [phone] Valinda HoarFAX [phone]      [facility] On 05/04/2015.   Why:  at 11:00am for medication management.   Contact information:   Marlana Salvagenna M. [facility] [address] [place], [place] [address] VOICE [phone] Valinda HoarFAX [phone]      Patient denies SI/HI: Yes,  Pt denies    Safety Planning and Suicide Prevention discussed: Yes,  with father; see SPE note for further details  Have you used any form of tobacco in the last 30 days? (Cigarettes, Smokeless Tobacco, Cigars, and/or Pipes): Patient Refused Screening  Has patient been referred to the Quitline?: Patient refused referral  Elaina HoopsCarter, Leelynn Whetsel M 04/08/2015, 10:24 AM

## 2015-04-08 NOTE — [facility] Group Notes (Signed)
[facility] Note  04/08/2015 8:45 AM  Pt did not attend, declined invitation.   Chad CordialLauren Carter, LCSWA 04/08/2015 10:21 AM

## 2015-04-08 NOTE — Progress Notes (Signed)
Discharge note: Pt received both written and verbal discharge instructions. Pt verbalized understanding of discharge instructions. Pt agreed to f/u appt and med regimen. Pt received prescriptions and belongings at d/c. Pt safely left [facility].

## 2015-06-03 ENCOUNTER — Telehealth (HOSPITAL_COMMUNITY): Payer: Self-pay

## 2015-06-03 DIAGNOSIS — F322 Major depressive disorder, single episode, severe without psychotic features: Secondary | ICD-10-CM

## 2015-06-03 MED ORDER — CITALOPRAM HYDROBROMIDE 20 MG PO TABS: 20.0000 mg | ORAL_TABLET | Freq: Every day | ORAL | Status: DC

## 2015-06-03 NOTE — Telephone Encounter (Signed)
Telephone call with Dr. Jama Flavorsobos regarding call from patient requesting a refill of his Celexa.  States [facility] and seeing [facility] psychiatrist there now but could not fill his medication at school pharmacy as they are closed for the holidays.  Requests medication to last until he returns to school on 06/10/14.  Dr. Jama Flavorsobos agreed to fill one time for 10 day supply of Celexa but would not fill again and patient would need to follow up with his new psychiatrist upon his return to school for any more refills.  New one time 10 day order for patient's Celexa e-scribed to [facility] [place] [address]

## 2016-10-26 ENCOUNTER — Other Ambulatory Visit (HOSPITAL_COMMUNITY): Payer: BC Managed Care – PPO | Attending: Psychiatry | Admitting: Licensed Clinical Social Worker

## 2016-10-26 ENCOUNTER — Encounter (INDEPENDENT_AMBULATORY_CARE_PROVIDER_SITE_OTHER): Payer: Self-pay

## 2016-10-26 DIAGNOSIS — F332 Major depressive disorder, recurrent severe without psychotic features: Secondary | ICD-10-CM

## 2016-10-26 DIAGNOSIS — F322 Major depressive disorder, single episode, severe without psychotic features: Secondary | ICD-10-CM | POA: Insufficient documentation
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
## 2016-10-27 ENCOUNTER — Other Ambulatory Visit (HOSPITAL_COMMUNITY): Payer: BC Managed Care – PPO | Admitting: Specialist

## 2016-10-27 ENCOUNTER — Other Ambulatory Visit (HOSPITAL_COMMUNITY): Payer: BC Managed Care – PPO | Admitting: Licensed Clinical Social Worker

## 2016-10-27 DIAGNOSIS — F322 Major depressive disorder, single episode, severe without psychotic features: Secondary | ICD-10-CM

## 2016-10-27 DIAGNOSIS — F332 Major depressive disorder, recurrent severe without psychotic features: Secondary | ICD-10-CM

## 2016-10-27 NOTE — Psych (Signed)
Behavioral Health Partial Program Assessment Note  Date: 10/27/2016 Name: Steve BannerRobert Hodge MRN: [id number]  Chief Complaint: self  Subjective:depressed and cannot seem to get better   WUJ:WJXBJYNWGNHPI:depression started after break up with girlfriend after a long term relationship.  She left him fairly abruptly and he has not been able to get over the loss.  He finds himself thinking about what part he contributed to the breakup and cannot stop the thoughts even though his head says all the right things about letting it go.  His friends have all gone their separate ways and that has led to further loneliness.  He is not that close to his family and has no support people.Marland Kitchen.  He dropped out of school temporarily because he cannot get motivated or concentrate.  Nothing matters, no energy, feels sad, crying spells, not actively suicidal but nothing matters at the same time.  He was hospitalized about 2 years ago after the breakup and has been in therapy since.Patient is a 21 y.o. Caucasian male presents with depression and anxiety.  Patient was enrolled in partial psychiatric program on 10/27/16.  Primary complaints include: agitation, anxiety, depression worse, poor concentration and relationship difficulties.  Onset of symptoms was abrupt with gradually worsening course since that time. Psychosocial Stressors include the following: relationship.   I have reviewed the hospital discharge and the history as presented by the patient  Complaints of Pain: nonear Past Psychiatric History:  Father alcoholic he believes  Currently in treatment with partial hospital.  Substance Abuse History: none Use of Alcohol: denied beyond occasional social useUse of Caffeine: other not an issue Use of over the counter: not an issue  No past surgical history on file.  Past Medical History:  Diagnosis Date  . Eczema   . HSV-1 infection    Outpatient Encounter Prescriptions as of 10/27/2016  Medication Sig  . citalopram  (CELEXA) 20 MG tablet Take 1 tablet (20 mg total) by mouth daily.  . hydrocerin (EUCERIN) CREA Apply 1 application topically 2 (two) times daily.  . hydrOXYzine (ATARAX/VISTARIL) 25 MG tablet Take 1 tablet (25 mg total) by mouth every 6 (six) hours as needed for itching or anxiety.  . triamcinolone (KENALOG) 0.025 % cream Apply topically 2 (two) times daily.   No facility-administered encounter medications on file as of 10/27/2016.    Allergies  Allergen Reactions  . Amoxicillin     Had childhood reaction    Social History  Substance Use Topics  . Smoking status: Never Smoker  . Smokeless tobacco: Not on file  . Alcohol use No   Functioning Relationships: alone & isolated Education: College       Please specify degree: few credits short of BS Other Pertinent History: None No family history on file.   Review of Systems Constitutional: negative Eyes: negative Ears, nose, mouth, throat, and face: negative Respiratory: negative Cardiovascular: negative Gastrointestinal: negative Genitourinary: negative Integument/breast: negative Hematologic/lymphatic: negative Musculoskeletal: negative Neurological: negative Behavioral/Psych: depression Endocrine: negative Allergic/Immunologic: negative  Objective:  There were no vitals filed for this visit.  Physical Exam: No exam performed today, no exam necessary.  Mental Status Exam: Appearance:  Well groomed Psychomotor::  Within Normal Limits Attention span and concentration: Normal Behavior: calm, cooperative and adequate rapport can be established Speech:  normal pitch and normal volume Mood:  depressed Affect:  normal and mood-congruent Thought Process:  Coherent and Goal Directed Thought Content:  Logical Orientation:  person, place and time/date Cognition:  grossly intact Insight:  Intact Judgment:  Intact Estimate of Intelligence: Above average Fund of knowledge: Intact Memory: Recent and remote intact Abnormal  movements: None Gait and station: Normal  Assessment:  Diagnosis: Severe episode of recurrent major depressive disorder, without psychotic features (HCC) [F33.2] 1. Severe episode of recurrent major depressive disorder, without psychotic features (HCC)     Indications for admission: depression with hopelessness and not responding to outpatient treatment  Plan: patient enrolled in Partial Hospitalization Program  Treatment options and alternatives reviewed with patient and patient understands the above plan.   Comments: Mr Sandoval is not convinced that any therapy can help but feels so bad he will try anything .    Carolanne Grumbling, MD

## 2016-10-27 NOTE — Therapy (Signed)
[facility]AV[facility] PART[facility] H[facility]PITAL[facility] PROGRAM [address] [place], [place], [address] Phone: [phone]   Fax:  [phone]  Occupational Therapy Evaluation  Patient Details  Name: Steve Hodge MRN: [id number] Date of Birth: 03-Jan-1996 No Data Recorded  Encounter Date: 10/27/2016      OT End of Session - 10/27/16 2217    Visit Number 1   Number of Visits 6   Date for OT Re-Evaluation 11/17/16   Authorization Type [employer]   OT Start Time 1045   OT Stop Time 1200   OT Time Calculation (min) 75 min   Activity Tolerance Patient tolerated treatment well   Behavior During Therapy [facility] for tasks assessed/performed      Past Medical History:  Diagnosis Date  . Eczema   . HSV-1 infection     No past surgical history on file.  There were no vitals filed for this visit.      Subjective Assessment - 10/27/16 2215    Currently in Pain? No/denies        OT assessment Diagnosis  Major Depressive Disorder Past medical history anxielty Living situation:  Lives in dorm ADLs indepdnent Work full time student Leisure  rest Social supportsamll group of friends Struggles concentration OT goal relate to others  General Causality Orientation Scale   Subscore Percentile Score  Autonomy 68 91.93  Control 46 35.27  Impersonal 34 11.05   Motivation Type  Motivation type Explanation  o Autonomy-oriented The individual is clear about what he or she is doing.  There is clear connection between behavior and interest/personal goals.  Motivation is intact.  o Control-oriented The individual's behavior is likely determined by internal or external factors, and often associated with subjective feelings of "have-to" or sense of coercion.  Motivational interventions may benefit and prevent potential motivational deterioration over time.  o Impersonal/amotivational There is a lack of connection between any of the individuals behavior and his or  her personal goals.  The individual is likely in a passivity state or manifests non-goal-directed behavior.  This is considered a type of motivational deficit and warrants motivational interventions.  o Mixed The individual does not have a prominent motivational orientation.  Currently there is no clear theoretical explanation, and research shows this motivation type does not benefit from motivational interventions.   COmpleted group on stress management including definition, causes, symptoms and copings skills.    Assessment:  Patient demonstrates mixed motivation type.  Patient will benefit from occupational therapy intervention in order to improve time management, financial management, stress management, job readiness skills, social skills,sleep hygiene, exercise and healthy eating habits,  and health management skills and other psychosocial skills needed for preparation to return to full time community living and to be a productive community member.   Plan:  Patient will participate in skilled occupational therapy sessions individually or in a group setting to improve coping skills, psychosocial skills, and emotional skills required to return to prior level of function as a productive community member. Treatment will be 1-2 times per week for 2-6 weeks.                           OT Education - 10/27/16 2215    Education provided Yes   Education Details stress management skills   Person(s) Educated Patient   Methods Explanation;Handout   Comprehension Verbalized understanding          OT Short Term Goals -  10/27/16 2222      OT SHORT TERM GOAL #1   Title Patient will be educated on strategies to improve psychosocial skills needed to participate fully in all daily, work, and leisure activities.   Time 3   Period Weeks   Status New     OT SHORT TERM GOAL #2   Title Patient will be educated on a HEP and independent with implementation of HEP.   Time 3   Period Weeks    Status New     OT SHORT TERM GOAL #3   Title Patient will independently apply psychosocial skills and coping mechanisms to her daily activities in order to function independently.   Time 3   Period Weeks   Status New                  Plan - 10/27/16 2218    Rehab Potential Good   OT Frequency 2x / week   OT Duration Other (comment)  3 weeks   OT Treatment/Interventions Self-care/ADL training  coping skills, psychosocialskills      Patient will benefit from skilled therapeutic intervention in order to improve the following deficits and impairments:   (decreased coping skills, decreased pyschosocial skills)  Visit Diagnosis: Major depressive disorder, single episode, severe without psychotic features [facility])    Problem List Patient Active Problem List   Diagnosis Date Noted  . MDD (major depressive disorder), single episode, severe , no psychosis (HCC) 04/05/2015  . MDD (major depressive disorder), single episode 04/04/2015    Shirlean Mylar, MHA, OTR/L [phone]  10/27/2016, 10:25 PM  [facility] BEHAVIORAL HEALTH PARTIAL HOSPITALIZATION PROGRAM [address] [place], [place], [address] Phone: [phone]   Fax:  [phone]  Name: Steve Hodge MRN: [id number] Date of Birth: [birth year]

## 2016-10-27 NOTE — Psych (Signed)
   [facility] PHP THERAPIST PROGRESS NOTE  Steve Hodge [id number]  Session Time: 9-2  Participation Level: Active  Behavioral Response: CasualAlertDepressed  Type of Therapy: Group Therapy  Treatment Goals addressed: Coping  Interventions: CBT, DBT and Supportive  Summary:  9:00 - 10:30 Clinician led check-in regarding current stressors and situation, and review of patient completed daily inventory. Clinician utilized active listening and empathetic response and validated patient emotions. Clinician facilitated processing group on pertinent issues.  10:30 -11:30: OT Group 11:30 - 12:45 Clinician led psychotherapy group on how patients deal with symptoms of depression and anxiety and healthier alternatives. 12:45 - 1:50 Clinician introduced topic of "Cognitive Distortions". Patients identified cognitive distortions they often have and ways to combat these distortions. 1:50 - 2:00 Clinician led check-out. Clinician assessed for immediate needs, medication compliance and efficacy, and safety concerns.   Suicidal/Homicidal: Nowithout intent/plan  Therapist Response: Steve Hodge is a 21 y.o. male who presents with depression symptoms. Patient arrived within time allowed and reports he is feeling "depressed." Patient rates his mood at a 4 on a 1- 10 scale with 10 being great. Patient engaged in activity and discussion. Patient discussed stigma related to mental health and how he gives up on positive plans made due to feeling/thinking "what's the point?". Patient has not demonstrated progress due to today being his first day in PHP. Patient denies SI/HI/self-harm thoughts at end of session.   Plan: Patient will continue in PHP and medication management while working on increasing mood stabilization.  Diagnosis: Severe episode of recurrent major depressive disorder, without psychotic features (HCC) [F33.2]    1. Severe episode of recurrent major depressive disorder, without psychotic  features (HCC)     [name], LPCA 10/27/2016

## 2016-10-28 ENCOUNTER — Other Ambulatory Visit (HOSPITAL_COMMUNITY): Payer: BC Managed Care – PPO | Admitting: Licensed Clinical Social Worker

## 2016-10-28 ENCOUNTER — Encounter (HOSPITAL_COMMUNITY): Payer: Self-pay

## 2016-10-28 VITALS — BP 110/74 | HR 77 | Ht 74.25 in | Wt 154.4 lb

## 2016-10-28 DIAGNOSIS — F322 Major depressive disorder, single episode, severe without psychotic features: Secondary | ICD-10-CM | POA: Diagnosis not present

## 2016-10-28 DIAGNOSIS — F332 Major depressive disorder, recurrent severe without psychotic features: Secondary | ICD-10-CM

## 2016-10-28 NOTE — Progress Notes (Signed)
Patient presented with flat affect, depressed mood and reports his current level of depression a 6, anxiety a 5 and hopelessness a 7 on a scale of 0-10 with 0 being none and 10 the worst he could experience.  Patient reports a long history of thoughts of wanting to harm self "since I was a boy" and even admits he has a plan but denies any current intent or means to harm self and did not want to share plan only as his account of having something he likes holding onto.  Patient reports some history of taking DBT and doing other individual therapy but reports at times does thinks aspects of therapy not helpful.  Patient reports so far doing okay with PHP and plans to pick out things he feels may be helpful in controlling or managing thoughts as they come.  Patient reported he recently took Trintellex but had a bad experience with it and not currently on medication for depression. Patient stated he was not apposed to try other medications for anxiety and depression as admits his anxiety often causes increased symptoms of depression.  Patient reported some positive response from Celexa in the past but nothing overly noticeable.  Patient may consider trying other antidepressants and [facility] if would like to try something for this and anxiety.  Patient reports history of problems with sleep but reports this has improved since returning from recent trip to [place] and now sleeping at least 8 hours a night most nights.  Patient reported appetite normal at present as admits he never eats more than 1-2 times a day and admits optimistic but skeptical about improving with PHP.  Patient reports no safety concerns at this time and agreed to contact this nurse or PHP staff or seek out emergency care if any symptoms worsened and will see again in the coming week.

## 2016-10-28 NOTE — Psych (Signed)
   [facility]  [name] [id number]  Session Time: 9-2  Participation Level: Active  Behavioral Response: CasualAlertDepressed  Type of Therapy: Group Therapy  Treatment Goals addressed: Coping  Interventions: CBT, DBT and Supportive  Summary:  9:00 - 10:30 Clinician led check-in regarding current stressors and situation, and review of patient completed daily inventory. Clinician utilized active listening and empathetic response and validated patient emotions. Clinician facilitated processing group on pertinent issues.  10:30 -11:00:  Pt led psychoeducation group on the tenets of healthy and unhealthy relationships. 11:00 - 12:00 Pt led activity on the relationships in our lives. 12:00 - 12:50 Clinician led psychotherapy group on what patients want out of a relationship and how past hurts play into our perception of relationships. 12:50 - 1:00 Clinician led check-out. Clinician assessed for immediate needs, medication compliance and efficacy, and safety concerns.   Suicidal/Homicidal: Nowithout intent/plan  Therapist Response: [name] is a 21 y.o. male who presents with depression symptoms. Patient arrived within time allowed and reports he is feeling "really anxious." Patient rates his mood at a 3 on a 1- 10 scale with 10 being great. Patient engaged in activity and discussion. Patient discussed that he is ruminating about a past relationship and "mistakes" he made. Patient demonstrated some progress as evidenced by increased sharing. Patient denies SI/HI/self-harm thoughts at end of session.   Plan: Patient will continue in PHP and medication management while working on increasing mood stabilization.  Diagnosis: Severe episode of recurrent major depressive disorder, without psychotic features (HCC) [F33.2]    1. Severe episode of recurrent major depressive disorder, without psychotic features (HCC)     Donia GuilesJenny Mahamed Zalewski, LCSW 10/28/2016

## 2016-10-31 ENCOUNTER — Other Ambulatory Visit (HOSPITAL_COMMUNITY): Payer: Self-pay

## 2016-11-01 ENCOUNTER — Other Ambulatory Visit (HOSPITAL_COMMUNITY): Payer: BC Managed Care – PPO | Admitting: Occupational Therapy

## 2016-11-01 ENCOUNTER — Other Ambulatory Visit (HOSPITAL_COMMUNITY): Payer: BC Managed Care – PPO | Admitting: Psychiatry

## 2016-11-01 DIAGNOSIS — F332 Major depressive disorder, recurrent severe without psychotic features: Secondary | ICD-10-CM

## 2016-11-01 DIAGNOSIS — F322 Major depressive disorder, single episode, severe without psychotic features: Secondary | ICD-10-CM | POA: Diagnosis not present

## 2016-11-01 NOTE — Psych (Signed)
   [facility]  [name] [id number]  Session Time: 9-2  Participation Level: Active  Behavioral Response: CasualAlertDepressed  Type of Therapy: Group Therapy  Treatment Goals addressed: Coping  Interventions: CBT, DBT and Supportive  Summary:  9:00 - 10:30 Clinician led check-in regarding current stressors and situation, and review of patient completed daily inventory. Clinician utilized active listening and empathetic response and validated patient emotions. Clinician facilitated processing group on pertinent issues.  10:30 -11:30: OT Group 11:30 - 12:45 Clinician led psychotherapy group on how societal pressure and stigma impacts mental health.  12:45 - 1:50 Clinician introduced topic of "Boundaries". Patients discussed the difference between rigid, porous, and healthy boundaries. Patients identified different areas in life where boundaries need to be addressed. 1:50 - 2:00 Clinician led check-out. Clinician assessed for immediate needs, medication compliance and efficacy, and safety concerns.   Suicidal/Homicidal: Nowithout intent/plan  Therapist Response: Barbie BannerRobert [name] is a 21 y.o. male who presents with depression symptoms. Patient arrived within time allowed and reports he is feeling "anxious." Patient rates his mood at a 4 on a 1- 10 scale with 10 being great. Patient engaged in activity and discussion. Patient discussed issues with his mom and brother. Patient demonstrated some progress as evidenced by increased feedback and participation. Patient denies SI/HI/self-harm thoughts at end of session.   Plan: Patient will continue in PHP and medication management while working on increasing mood stabilization.  Diagnosis: Severe episode of recurrent major depressive disorder, without psychotic features (HCC) [F33.2]    1. Severe episode of recurrent major depressive disorder, without psychotic features (HCC)     Donia GuilesJenny Tali Coster,  LCSW 11/01/2016

## 2016-11-01 NOTE — Therapy (Signed)
[facility] PARTIAL HOSPITALIZATION PROGRAM [address] [place], [place], [address] Phone: [phone]   Fax:  [phone]  Occupational Therapy Treatment  Patient Details  Name: Steve Hodge MRN: [id number] Date of Birth: [birth year] No Data Recorded  Encounter Date: 11/01/2016      OT End of Session - 11/01/16 1233    Visit Number 2   Number of Visits 6   Date for OT Re-Evaluation 11/17/16   Authorization Type [employer]   OT Start Time 1030   OT Stop Time 1135   OT Time Calculation (min) 65 min   Activity Tolerance Patient tolerated treatment well   Behavior During Therapy [facility] for tasks assessed/performed      Past Medical History:  Diagnosis Date  . Eczema   . HSV-1 infection     No past surgical history on file.  There were no vitals filed for this visit.      Subjective Assessment - 11/01/16 1233    Currently in Pain? No/denies      OT Treatment Session: Exercise   S:  "I used to go to the gym with my friend but I don't anymore." O:  Patient actively participated in the following skilled occupational therapy group this date:  Exercise: Discussed strategies to begin an exercises program, contraindications to exercise, and tips for incorporating exercise into daily routine. Pt participated in leading group in stretching and exercise activity, as well as brainstorming how to adapt exercises when necessary. Pt participated in exercise "scattegories" game focusing on exercise, stress reduction, and health benefits of exercise. Pt participated in group discussions and activities throughout session.  A:  Patient participated in skilled occupational therapy group for exercise skills this date.  Patient was engaged and appears open to strategies introduced.  Patient is not actively exercising at this time, does acknowledge health benefits of exercising.  Pt provided with tips for incorporating exercises into routine, at home  workouts, benefits of exercise, and contraindications for exercise handouts. Pt provided with chart to begin documenting daily exercise-intentional exercise or non-intentional.    P:  Continue participation in skilled occupational therapy groups 1-2 times per week for 2 weeks in order to gain the necessary skills needed to return to full time community living and learn effective coping strategies to be a productive community resident.                OT Short Term Goals - 11/01/16 1233      OT SHORT TERM GOAL #1   Title Patient will be educated on strategies to improve psychosocial skills needed to participate fully in all daily, work, and leisure activities.   Time 3   Period Weeks   Status On-going     OT SHORT TERM GOAL #2   Title Patient will be educated on a HEP and independent with implementation of HEP.   Time 3   Period Weeks   Status On-going     OT SHORT TERM GOAL #3   Title Patient will independently apply psychosocial skills and coping mechanisms to her daily activities in order to function independently.   Time 3   Period Weeks   Status On-going                  Plan - 11/01/16 1233    Rehab Potential Good   OT Frequency 2x / week   OT Duration Other (comment)  3 weeks   OT Treatment/Interventions Self-care/ADL training  coping skills, psychosocialskills   Consulted and Agree with Plan of Care Patient      Patient will benefit from skilled therapeutic intervention in order to improve the following deficits and impairments:   (decreased coping skills, decreased pyschosocial skills)  Visit Diagnosis: Severe episode of recurrent major depressive disorder, without psychotic features [facility](HCC)    Problem List Patient Active Problem List   Diagnosis Date Noted  . MDD (major depressive disorder), single episode, severe , no psychosis (HCC) 04/05/2015  . MDD (major depressive disorder), single episode 04/04/2015   Ezra SitesLeslie Kynisha Memon, OTR/L   [phone] 11/01/2016, 12:33 PM  [facility] [address] [place], [place], [address] Phone: [phone]   Fax:  [phone]  Name: Barbie BannerRobert Hursey MRN: [id number] Date of Birth: 1996/04/16

## 2016-11-02 ENCOUNTER — Other Ambulatory Visit (HOSPITAL_COMMUNITY): Payer: Self-pay

## 2016-11-03 ENCOUNTER — Other Ambulatory Visit (HOSPITAL_COMMUNITY): Payer: BC Managed Care – PPO | Admitting: Specialist

## 2016-11-03 ENCOUNTER — Other Ambulatory Visit (HOSPITAL_COMMUNITY): Payer: BC Managed Care – PPO | Admitting: Licensed Clinical Social Worker

## 2016-11-03 DIAGNOSIS — F322 Major depressive disorder, single episode, severe without psychotic features: Secondary | ICD-10-CM | POA: Diagnosis not present

## 2016-11-03 MED ORDER — CLONAZEPAM 1 MG PO TABS: 1.0000 mg | ORAL_TABLET | Freq: Three times a day (TID) | ORAL | 0 refills | Status: DC

## 2016-11-03 MED ORDER — CITALOPRAM HYDROBROMIDE 20 MG PO TABS: 20.0000 mg | ORAL_TABLET | Freq: Every day | ORAL | 0 refills | Status: DC

## 2016-11-03 NOTE — Progress Notes (Signed)
Progress note  Steve Hodge remains the same.  Does not see the group process helping.  Medications do not seem to be helping either.  Remains depressed with a sense that things will not change, has no energy or joy.  Could not bring himself to even return to group yesterday but forced himself to come today.     Talking to him is difficult in that he has a good intellectual grasp on the reality of his situation but cannot seem to connect it to emotions.  Crying, getting angry and even talking do not make any difference.  He seems to think concretely and has problems dealing with the gray state of emotions and relationships.  Seems to prefer certainty but if given that feedback would likely disagree with that statement.  Consequently trying to express empathy towards him is met with skepticism.  Plan:  Re start citalopram 20 mg daily and increase to 40 mg next week as the 20 mg is likely not enough.  Begin clonazepam 1 mg tid as a temporary measure as he says even when he feels better he gets overwhelmed by anxiety and still feels miserable.  Something like aripiprazole might help with his perseveration and inability to quit thinking obsessively about his former girlfriend but not now as he is not keen on medication and the atypicals have worrying side effects at times.

## 2016-11-04 ENCOUNTER — Encounter (HOSPITAL_COMMUNITY): Payer: Self-pay

## 2016-11-04 ENCOUNTER — Other Ambulatory Visit (HOSPITAL_COMMUNITY): Payer: BC Managed Care – PPO

## 2016-11-04 NOTE — Psych (Signed)
   [facility]  Steve BannerRobert Anne [id number]  Session Time: 9-2  Participation Level: Active  Behavioral Response: CasualAlertDepressed  Type of Therapy: Group Therapy  Treatment Goals addressed: Coping  Interventions: CBT, DBT and Supportive  Summary:  9:00 - 10:30 Clinician led check-in regarding current stressors and situation, and review of patient completed daily inventory. Clinician utilized active listening and empathetic response and validated patient emotions. Clinician facilitated processing group on pertinent issues.  10:30 -11:30: OT Group 11:30 - 12:45 Clinician led psychotherapy group on interpersonal issues. 12:45 - 1:50 Clinician introduced topic of "Communication". Clinician explained the difference between communication styles and healthy/unhealthy communication. Patients identified their main form of communication and reasons why it works, or reasons to think about changing it. 1:50 - 2:00 Clinician led check-out. Clinician assessed for immediate needs, medication compliance and efficacy, and safety concerns.     Suicidal/Homicidal: Nowithout intent/plan  Therapist Response: Steve BannerRobert Hodge is a 21 y.o. male who presents with depression symptoms. Patient arrived within time allowed and reports he is feeling "worse than usual." Patient rates his mood at a 5 on a 1- 10 scale with 10 being great. Patient engaged in activity and discussion. Patient discussed continued issues with interpersonal relationships and the desire to have a meaningful relationship. Patient demonstrated some progress as evidenced by increased feedback and participation. Patient denies SI/HI/self-harm thoughts at end of session.   Plan: Patient will continue in PHP and medication management while working on increasing mood stabilization.  Diagnosis: Major depressive disorder, single episode, severe without psychotic features (HCC) [F32.2]    1. Major depressive disorder,  single episode, severe without psychotic features (HCC)     Steve GuilesJenny Serenity Fortner, LCSW 11/04/2016

## 2016-11-07 ENCOUNTER — Other Ambulatory Visit (HOSPITAL_COMMUNITY): Payer: Self-pay

## 2016-11-08 ENCOUNTER — Other Ambulatory Visit (HOSPITAL_COMMUNITY): Payer: BC Managed Care – PPO | Attending: Psychiatry | Admitting: Licensed Clinical Social Worker

## 2016-11-08 ENCOUNTER — Other Ambulatory Visit (HOSPITAL_COMMUNITY): Payer: BC Managed Care – PPO | Admitting: Occupational Therapy

## 2016-11-08 DIAGNOSIS — F322 Major depressive disorder, single episode, severe without psychotic features: Secondary | ICD-10-CM | POA: Insufficient documentation

## 2016-11-08 DIAGNOSIS — R45851 Suicidal ideations: Secondary | ICD-10-CM | POA: Insufficient documentation

## 2016-11-08 NOTE — Psych (Signed)
   [facility]  Steve BannerRobert Hodge [id number]  Session Time: 9-2  Participation Level: Active  Behavioral Response: CasualAlertDepressed  Type of Therapy: Group Therapy  Treatment Goals addressed: Coping  Interventions: CBT, DBT, Supportive and Reframing  Summary:  9:00 - 10:30 Clinician led check-in regarding current stressors and situation, and review of patient completed daily inventory. Clinician utilized active listening and empathetic response and validated patient emotions. Clinician facilitated processing group on pertinent issues.  10:30 -11:30: OT Group 11:30 - 12:45 Clinician led psychotherapy group on adult issues and how to manage. 12:45 - 1:50 Clinician continued topic of "Distress Tolerance". Clinician finished discussing "ACCEPTS" and discussed self-soothing and how/when patients can employ these methods to help. Patients identified when these techniques may be helpful in their personal lives. 1:50 - 2:00 Clinician led check-out. Clinician assessed for immediate needs, medication compliance and efficacy, and safety concerns.     Suicidal/Homicidal: Yeswithout intent/plan  Therapist Response: Steve BannerRobert Hodge is a 21 y.o. male who presents with depression symptoms. Patient arrived within time allowed and reports he is feeling "worse than usual." Patient rates his mood at a 2 on a 1- 10 scale with 10 being great. Patient engaged in activity and discussion. Patient discussed continued issues with interpersonal relationships and the desire to have a meaningful relationship, and self-esteem issues. Patient demonstrated some progress as evidenced by increased willingness to try new methods to help him. Patient denies SI/HI/self-harm thoughts at end of session.   Plan: Patient will continue in PHP and medication management while working on increasing mood stabilization.  Diagnosis: Major depressive disorder, single episode, severe without psychotic features  (HCC) [F32.2]    1. Major depressive disorder, single episode, severe without psychotic features (HCC)     [name] J [name], LPCA 11/08/2016

## 2016-11-08 NOTE — Therapy (Signed)
[facility] [address] [place], [place], [address] Phone: [phone]   Fax:  [phone]  Occupational Therapy Treatment  Patient Details  Name: Steve Hodge MRN: [id number] Date of Birth: Jul 22, 1995 No Data Recorded  Encounter Date: 11/08/2016      OT End of Session - 11/08/16 2056    Visit Number 3   Number of Visits 6   Date for OT Re-Evaluation 11/17/16   Authorization Type [employer]   OT Start Time 1030   OT Stop Time 1135   OT Time Calculation (min) 65 min   Activity Tolerance Patient tolerated treatment well   Behavior [facility] Therapy [facility] for tasks assessed/performed      Past Medical History:  Diagnosis Date  . Eczema   . HSV-1 infection     No past surgical history on file.  There were no vitals filed for this visit.      Subjective Assessment - 11/08/16 2056    Currently in Pain? No/denies        OT Group: Financial Management  S: "I stop and think if items are worth x hours of work to afford them." O:  Patient actively participated in the following skilled occupational therapy treatment session this date:             Financial management-Discussed the importance of good financial management in all  areas of life and importance of saving. Pt participated in group discussion regarding current methods of budgeting and if these are effective or ineffective. He participated in identifying the "big five" necessities for financial living (food, shelter, insurance, taxes, and Transportation). Pt participated in group budgeting activity in which teams were given an income scenario and asked to budget for a month taking into account multiple variables (house/car/insurance/daycare/phone/etc). Teams then shared their developed budget with the group and discussed. He participated in group discussion on ways to save and how to develop a budget and spending plan-including knowing what you are  bringing in (income after taxes) and knowing your fixed and variable expenses that will be going out and how to manage these areas within a budget.  A:  Patient participated in skilled occupational therapy group for financial management skills this date.  Patient was engaged and open to strategies introduced. Pt provided with Budget Binder 2018 for beginning budgeting.  P:  Continue participation in skilled occupational therapy groups 1-2 times per week for 2 weeks in order to gain the necessary skills needed to return to full time community living and learn effective coping strategies to be a productive community resident. Follow up on HEP for developing a budget or savings plan.                         OT Short Term Goals - 11/01/16 1233      OT SHORT TERM GOAL #1   Title Patient will be educated on strategies to improve psychosocial skills needed to participate fully in all daily, work, and leisure activities.   Time 3   Period Weeks   Status On-going     OT SHORT TERM GOAL #2   Title Patient will be educated on a HEP and independent with implementation of HEP.   Time 3   Period Weeks   Status On-going     OT SHORT TERM GOAL #3   Title Patient will independently apply psychosocial skills and coping mechanisms to her daily activities in  order to function independently.   Time 3   Period Weeks   Status On-going                  Plan - 11/08/16 2057    Rehab Potential Good   OT Frequency 2x / week   OT Duration Other (comment)  3 weeks   OT Treatment/Interventions Self-care/ADL training  coping skills, psychosocial skills, community reintegration   Consulted and Agree with Plan of Care Patient      Patient will benefit from skilled therapeutic intervention in order to improve the following deficits and impairments:   (decreased coping skills, decreased pyschosocial skills)  Visit Diagnosis: Major depressive disorder, single episode, severe  without psychotic features [facility])    Problem List Patient Active Problem List   Diagnosis Date Noted  . MDD (major depressive disorder), single episode, severe , no psychosis (HCC) 04/05/2015  . MDD (major depressive disorder), single episode 04/04/2015    Ezra Sites, OTR/L  [phone] 11/08/2016, 8:58 PM  [facility] PARTIAL HOSPITALIZATION PROGRAM [address] [place], [place], [address] Phone: [phone]   Fax:  [phone]  Name: Steve Hodge MRN: [id number] Date of Birth: 25-Mar-1996

## 2016-11-08 NOTE — Psych (Signed)
Cln met with patient in individual as component of PHP. Cln checked in with pt regarding reported SI and current stressors. Cln worked with patient to create safety plan and goals that will lessen stressors. Cln discussed progress, current concerns, and discharge planning. Pt feels group has been helpful, and wants an additional day to make it a full week. He also desires more help with job/resume issues when discharged. Pt wants to return to regular therapist for care. Pt will need a psychiatrist.  Pt discussed still feeling "stuck" in the past about ex-girlfriend. Cln discussed ways to reframe thinking to look towards the future. Pt discussed having issues with making friends and wanting a job. Cln encouraged Pt to apply for jobs as a possible outlet for making friends as well. Pt is stuck on getting an "adult" job. Cln discussed options for current jobs while [employer] for "adult" job and reminded Pt current job does not have to be forever. Pt denies SI at the end of the meeting.  Lucella Pommier J. Kamaile Zachow, LPCA 11/08/16

## 2016-11-09 ENCOUNTER — Other Ambulatory Visit (HOSPITAL_COMMUNITY): Payer: BC Managed Care – PPO | Admitting: Licensed Clinical Social Worker

## 2016-11-09 DIAGNOSIS — F322 Major depressive disorder, single episode, severe without psychotic features: Secondary | ICD-10-CM | POA: Diagnosis not present

## 2016-11-09 DIAGNOSIS — F332 Major depressive disorder, recurrent severe without psychotic features: Secondary | ICD-10-CM

## 2016-11-09 MED ORDER — CITALOPRAM HYDROBROMIDE 40 MG PO TABS: 40.0000 mg | ORAL_TABLET | Freq: Every day | ORAL | 2 refills | Status: DC

## 2016-11-09 MED ORDER — CLONAZEPAM 1 MG PO TABS: 1.0000 mg | ORAL_TABLET | Freq: Three times a day (TID) | ORAL | 0 refills | Status: DC

## 2016-11-09 NOTE — Psych (Signed)
  [facility] Psych Discharge Summary  Steve BannerRobert Hodge [id number]  Admission date: 10/26/2016 Discharge date: 11/11/2016  Reason for admission: severe depression even after discharge from inpatient   Progress in Program Toward Treatment Goals: Steve Hodge says he sees no improvement.  Still feels depressed and life has no meaning.  Still cannot get the loss of his girlfriend out of his mind.  Still has suicidal thoughts and see no reason to live.    Progress (rationale): coming to the group got him out of the house.  He did participate and attend faithfully.  He seems some better looking from the outside in that his anxiety is less, he has spent time with friends that where he was happy and is planning to try to find things to get involved in to help him be occupied and feel better.  Still feels empty with a void emotionally.  Discharge Plan: Referral to Psychiatrist and Referral to Counselor/Psychotherapist    Carolanne GrumblingGerald Amear Strojny, MD 11/09/2016

## 2016-11-10 ENCOUNTER — Other Ambulatory Visit (HOSPITAL_COMMUNITY): Payer: BC Managed Care – PPO | Admitting: Licensed Clinical Social Worker

## 2016-11-10 ENCOUNTER — Other Ambulatory Visit (HOSPITAL_COMMUNITY): Payer: BC Managed Care – PPO | Admitting: Specialist

## 2016-11-10 DIAGNOSIS — F332 Major depressive disorder, recurrent severe without psychotic features: Secondary | ICD-10-CM

## 2016-11-10 DIAGNOSIS — F322 Major depressive disorder, single episode, severe without psychotic features: Secondary | ICD-10-CM | POA: Diagnosis not present

## 2016-11-10 NOTE — Psych (Signed)
   [facility]ERAP[facility]  Steve BannerRobert Hodge [id number]  Session Time: 9-2  Participation Level: Active  Behavioral Response: CasualAlertDepressed  Type of Therapy: Group Therapy  Treatment Goals addressed: Coping  Interventions: CBT, DBT, Supportive and Reframing  Summary:  9:00 - 10:30 Clinician led check-in regarding current stressors and situation, and review of patient completed daily inventory. Clinician utilized active listening and empathetic response and validated patient emotions. Clinician facilitated processing group on pertinent issues.  ?10:30 -12:00 Spiritual care group 12:00 - 12:45 Clinician led psychotherapy group on how to handle major changes and the decisions around making changes in life. 12:45 - 1:50 Relaxation group: Cln Forde RadonLeanne Yates led yoga group focused on retraining the body's response to stress.  ?1:50 - 2:00 Clinician led check-out. Clinician assessed for immediate needs, medication compliance and efficacy, and safety concerns.     Suicidal/Homicidal: Yeswithout intent/plan  Therapist Response: Steve BannerRobert Hodge is a 21 y.o. male who presents with depression symptoms. Patient arrived within time allowed and reports he is feeling "down." Patient rates his mood at a 4 on a 1- 10 scale with 10 being great. Patient engaged in activity and discussion. Patient discussed continued issues with interpersonal relationships and the desire to have a meaningful relationship. Patient demonstrated some progress as evidenced by increased willingness to be open to feedback. Patient denies SI/HI/self-harm thoughts at end of session.  Plan: Patient will continue in PHP and medication management while working on increasing mood stabilization.  Diagnosis: Severe recurrent major depression without psychotic features (HCC) [F33.2]    1. Severe recurrent major depression without psychotic features (HCC)     Steve Hodge, LPCA 11/10/2016

## 2016-11-10 NOTE — Therapy (Signed)
[place], [place], [address] Phone: [phone]   Fax:  [phone]  Occupational Therapy Treatment  Patient Details  Name: Steve Hodge MRN: [id number] Date of Birth: 1996-01-16 No Data Recorded  Encounter Date: 11/10/2016      OT End of Session - 11/10/16 1613    Visit Number 4   Number of Visits 6   Date for OT Re-Evaluation 11/17/16   Authorization Type [employer]   OT Start Time 1030   OT Stop Time 1130   OT Time Calculation (min) 60 min   Activity Tolerance Patient tolerated treatment well   Behavior During Therapy [facility] for tasks assessed/performed      Past Medical History:  Diagnosis Date  . Eczema   . HSV-1 infection     No past surgical history on file.  There were no vitals filed for this visit.      Subjective Assessment - 11/10/16 1613    Currently in Pain? No/denies      S:  I would like to write these soft skills down.  I see how they could be helpful. O:  Patient participated in job readiness occupational therapy group.  Patient was educated on definition and examples of hard vs soft skills, importance of soft skills.  Patient completed an ideal job environment and job task assessment and shared results. Patient provided education on [place] and donts of interviewing and soft skills required to maintain current employment. A:  Patient was engaged in group and reflected on the need for soft and hard skills to be successful.  P:  DC from skilled OT group this date.  Goals met.                         OT Education - 11/10/16 1613    Education provided Yes   Education Details job readiness skills, soft skills, interview dos and donts   Person(s) Educated Patient   Methods Explanation;Handout   Comprehension Verbalized understanding          OT Short Term Goals - 11/10/16 1614      OT SHORT TERM GOAL #1   Title Patient will be  educated on strategies to improve psychosocial skills needed to participate fully in all daily, work, and leisure activities.   Time 3   Period Weeks   Status Achieved     OT SHORT TERM GOAL #2   Title Patient will be educated on a HEP and independent with implementation of HEP.   Time 3   Period Weeks   Status Achieved     OT SHORT TERM GOAL #3   Title Patient will independently apply psychosocial skills and coping mechanisms to her daily activities in order to function independently.   Time 3   Period Weeks   Status Achieved                Patient will benefit from skilled therapeutic intervention in order to improve the following deficits and impairments:   (decreased coping skills and psychosocial skills )  Visit Diagnosis: Severe recurrent major depression without psychotic features [facility])    Problem List Patient Active Problem List   Diagnosis Date Noted  . MDD (major depressive disorder), single episode, severe , no psychosis ([place]) 04/05/2015  . MDD (major depressive disorder), single episode 04/04/2015    Vangie Bicker, [place], OTR/L [phone]  11/10/2016, 4:14 PM OCCUPATIONAL THERAPY  DISCHARGE SUMMARY  Visits from Start of Care: 4  Current functional level related to goals / functional outcomes: Able to cope with day to day issues   Remaining deficits: Lack of insight to realistic finances   Education / Equipment: See above Plan: Patient agrees to discharge.  Patient goals were met. Patient is being discharged due to meeting the stated rehab goals.  ?????         Vangie Bicker, MHA, OTR/L [place], [place], [address] Phone: [phone]   Fax:  [phone]  Name: Steve Hodge MRN: [id number] Date of Birth: [birth year]

## 2016-11-11 ENCOUNTER — Other Ambulatory Visit (HOSPITAL_COMMUNITY): Payer: BC Managed Care – PPO | Admitting: Licensed Clinical Social Worker

## 2016-11-11 DIAGNOSIS — F332 Major depressive disorder, recurrent severe without psychotic features: Secondary | ICD-10-CM

## 2016-11-11 DIAGNOSIS — F322 Major depressive disorder, single episode, severe without psychotic features: Secondary | ICD-10-CM | POA: Diagnosis not present

## 2016-11-11 NOTE — Psych (Signed)
   [facility] PHP THERAPIST PROGRESS NOTE  Steve Hodge [id number]  Session Time: 9-1  Participation Level: Active  Behavioral Response: CasualAlertDepressed  Type of Therapy: Group Therapy  Treatment Goals addressed: Coping  Interventions: CBT, DBT, Supportive and Reframing  Summary:  9:00 - 10:00 Clinician led check-in regarding current stressors and situation, and review of patient completed daily inventory. Clinician utilized active listening and empathetic response and validated patient emotions. Clinician facilitated processing group on pertinent issues.  10:00 -11:00: Clinician introduced topic of "Cognitive Distortions". Patients identified cognitive distortions they often have and ways to combat these distortions. 11:00 - 12:00: Clinician led psychotherapy group on rejection. The group watched "100 Days of Rejection" Ted-Talk. The group discussed how rejection and cognitive distortions are related.  12:00 - 12:50 Clinician introduced topic of emotional mind and how feelings do not equal fact. 12:50 - 1:00 Clinician led check-out. Clinician assessed for immediate needs, medication compliance and efficacy, and safety concerns.   Suicidal/Homicidal: Yeswithout intent/plan  Therapist Response: Steve Hodge is a 21 y.o. male who presents with depression symptoms. Patient arrived within time allowed and reports he is feeling "exhausted mentally." Patient rates his mood at a 2 on a 1- 10 scale with 10 being great. Patient engaged in activity and discussion. Patient discussed continued issues with focusing on the future instead of what is controllable in the present moment. Patient demonstrated some progress as evidenced by increased willingness to be open to feedback. Patient denies SI/HI/self-harm thoughts at end of session.  Plan: Patient will discharge from PHP due to meeting treatment goals of decreased depression and anxiety symptoms and increased coping abilities. Progress was  measured by observation, self-report, and scales. Psychiatrist has approved discharge and patient reports alignment with discharge plan. Patient will step down to outpatient therapy and psychiatry, Patient has requested to return to his previous providers. Patient is scheduled within this agency. Psychiatry: Dr. Gaspar SkeetersEskir 6/15 at 9:00; Therapy: Olene CravenJessica Spence.  Patient was also [facility] to [facility].  Patient scheduled orientations with both. Patient denies any SI/HI at time of discharge.    Diagnosis: Severe recurrent major depression without psychotic features (HCC) [F33.2]    1. Severe recurrent major depression without psychotic features (HCC)     Maegan Buller J [name], LPCA 11/11/2016

## 2016-11-11 NOTE — Psych (Signed)
   [facility]  Barbie BannerRobert Kama [id number]  Session Time: 9-2  Participation Level: Active  Behavioral Response: CasualAlertDepressed  Type of Therapy: Group Therapy  Treatment Goals addressed: Coping  Interventions: CBT, DBT, Supportive and Reframing  Summary:  9:00 - 10:30 Clinician led check-in regarding current stressors and situation, and review of patient completed daily inventory. Clinician utilized active listening and empathetic response and validated patient emotions. Clinician facilitated processing group on pertinent issues.  10:30 -11:30: OT Group 11:30 - 12:45 Clinician led psychotherapy group on the way the past effects our current relationships. 12:45 - 1:50 Clinician introduced topic of emotions and discussed their role in our lives and putting them in a healthy context.  1:50 - 2:00 Clinician led check-out. Clinician assessed for immediate needs, medication compliance and efficacy, and safety concerns.   Suicidal/Homicidal: Yeswithout intent/plan  Therapist Response: Barbie BannerRobert Rondon is a 21 y.o. male who presents with depression symptoms. Patient arrived within time allowed and reports he is feeling "relatively fine." Patient rates his mood at a 4 on a 1- 10 scale with 10 being great. Pt reports experiencing passive Si last night and denies active thoughts or plan and intent. Patient engaged in activity and discussion. Patient continues to struggle with intellectualizing problems and not connecting on an emotional level. Patient demonstrated some progress as evidenced by seeking out relational activities last night. Patient denies SI/HI/self-harm thoughts at end of session.  Plan: Patient will continue in PHP and medication management while working on increasing mood stabilization.  Diagnosis: Severe recurrent major depression without psychotic features (HCC) [F33.2]    1. Severe recurrent major depression without psychotic features (HCC)      Donia GuilesJenny Emmaleah Meroney, LCSW 11/11/2016

## 2016-11-14 ENCOUNTER — Other Ambulatory Visit (HOSPITAL_COMMUNITY): Payer: Self-pay

## 2016-11-15 ENCOUNTER — Other Ambulatory Visit (HOSPITAL_COMMUNITY): Payer: Self-pay

## 2016-11-15 ENCOUNTER — Ambulatory Visit (HOSPITAL_COMMUNITY): Payer: Self-pay

## 2016-11-16 ENCOUNTER — Other Ambulatory Visit (HOSPITAL_COMMUNITY): Payer: Self-pay

## 2016-11-17 ENCOUNTER — Other Ambulatory Visit (HOSPITAL_COMMUNITY): Payer: Self-pay

## 2016-11-17 ENCOUNTER — Ambulatory Visit (HOSPITAL_COMMUNITY): Payer: Self-pay

## 2016-11-18 ENCOUNTER — Encounter (HOSPITAL_COMMUNITY): Payer: Self-pay | Admitting: Psychiatry

## 2016-11-18 ENCOUNTER — Ambulatory Visit (INDEPENDENT_AMBULATORY_CARE_PROVIDER_SITE_OTHER): Payer: BC Managed Care – PPO | Admitting: Psychiatry

## 2016-11-18 ENCOUNTER — Other Ambulatory Visit (HOSPITAL_COMMUNITY): Payer: Self-pay

## 2016-11-18 VITALS — BP 116/68 | HR 46 | Ht 74.0 in | Wt 152.6 lb

## 2016-11-18 DIAGNOSIS — Z79899 Other long term (current) drug therapy: Secondary | ICD-10-CM | POA: Diagnosis not present

## 2016-11-18 DIAGNOSIS — Z811 Family history of alcohol abuse and dependence: Secondary | ICD-10-CM

## 2016-11-18 DIAGNOSIS — Z818 Family history of other mental and behavioral disorders: Secondary | ICD-10-CM

## 2016-11-18 DIAGNOSIS — Z814 Family history of other substance abuse and dependence: Secondary | ICD-10-CM | POA: Diagnosis not present

## 2016-11-18 DIAGNOSIS — F332 Major depressive disorder, recurrent severe without psychotic features: Secondary | ICD-10-CM | POA: Diagnosis not present

## 2016-11-18 MED ORDER — CLONAZEPAM 1 MG PO TABS: 1.0000 mg | ORAL_TABLET | Freq: Three times a day (TID) | ORAL | 0 refills | Status: DC | PRN

## 2016-11-18 MED ORDER — ARIPIPRAZOLE 2 MG PO TABS: 2.0000 mg | ORAL_TABLET | Freq: Every day | ORAL | 1 refills | Status: DC

## 2016-11-18 NOTE — Progress Notes (Signed)
Psychiatric Initial Adult Assessment   Patient Identification: Steve BannerRobert Hodge MRN:  [id number] Date of Evaluation:  11/18/2016 Referral Source: PHP Chief Complaint:  depression, anxiety Visit Diagnosis:    ICD-10-CM   1. Severe recurrent major depression without psychotic features (HCC) F33.2 clonazePAM (KLONOPIN) 1 MG tablet    ARIPiprazole (ABILIFY) 2 MG tablet    History of Present Illness:  Steve BannerRobert Hodge 's a 21 year old male who recently completed participation in the partial hospitalization program. He has one psychiatric hospitalization 2 years ago for depression and suicidality. He presents today to establish psychiatric medication management.  He has been on Celexa, and clonazepam, [facility].  Patient spends much of the visit ruminating about this breakup with his ex-girlfriend. He reports that this was approximately 1-1/2 years ago, but his continued infiltrate his daily life, and he continues to grieve this loss. He reports that the difficulty he has with that, is that the relation and did so abruptly, and he still does not fully understand why she broke up with. He admits that he can be quite obsessive and micromanaging, and suspects this was a control reading factor.   He feels that the clonazepam has been very helpful for his anxiety, and so much that it makes the anxiety go away. He continues to struggle with depression, difficulty looking forward to the future. He has difficulty thinking about what he wants to do with his life, and forming her developing any relationships with others. He reports that he is taking Celexa 40 mg daily, and has yet to notice any benefits. I discussed Abilify, the black box warning, and the risks and benefits of this medication. I recommended that the patient start this medication to help augment his antidepressant therapies, and reduce the perseveration that he continues to struggle with.  He denies any auditory or visualizations,  and has never had any past episodes of mania. He does seem to have an OCD-like pattern in terms of rumination and obsessive thinking. I spent time with the patient hearing about how his OCD-like thinking helps him to succeed in his schooling in his work, but has also created significant distress and his personal life.  In our interaction, the patient is over intellectualizing at times, but is able to step back into talking about his feelings, and experiencing some of his emotions. I continued to encourage him to engage in therapy. He is actively seing Therapist - Olene CravenJessica Spence.   Associated Signs/Symptoms: Depression Symptoms:  depressed mood, anhedonia, insomnia, feelings of worthlessness/guilt, difficulty concentrating, anxiety, (Hypo) Manic Symptoms:  Irritable Mood, Anxiety Symptoms:  Obsessive Compulsive Symptoms:   obsessions, ruminations, perseveration, Psychotic Symptoms:  none PTSD Symptoms: Negative  Past Psychiatric [facility]  Previous Psychotropic Medications: Yes   Substance Abuse History in the last 12 months:  No.  Consequences of Substance Abuse: Negative  Past Medical History:  Past Medical History:  Diagnosis Date  . Eczema   . HSV-1 infection     Past Surgical History:  Procedure Laterality Date  . TONSILLECTOMY      Family Psychiatric History: alcohol abuse, depression, OCD  Family History:  Family History  Problem Relation Age of Onset  . Alcohol abuse Father   . Physical abuse Father   . Physical abuse Sister   . Alcohol abuse Brother   . Drug abuse Brother   . Physical abuse Brother   . OCD Paternal Uncle  Social History:   Social History   Social History  . Marital status: Single    Spouse name: N/A  . Number of children: N/A  . Years of education: N/A   Social History Main Topics  . Smoking status: Never Smoker  . Smokeless tobacco: Never  Used  . Alcohol use No     Comment: Occasioal use  . Drug use: No  . Sexual activity: Not Currently   Other Topics Concern  . None   Social History Narrative  . 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 Amoxicillin     Had childhood reaction    Metabolic Disorder Labs: No results found for: HGBA1C, MPG No results found for: PROLACTIN No results found for: CHOL, TRIG, HDL, CHOLHDL, VLDL, LDLCALC   Current Medications: Current Outpatient Prescriptions  Medication Sig Dispense Refill  . citalopram (CELEXA) 40 MG tablet Take 1 tablet (40 mg total) by mouth daily. 30 tablet 2  . ARIPiprazole (ABILIFY) 2 MG tablet Take 1 tablet (2 mg total) by mouth daily. 30 tablet 1  . clonazePAM (KLONOPIN) 1 MG tablet Take 1 tablet (1 mg total) by mouth 3 (three) times daily as needed for anxiety. Take 1/2 or 1 tablet 3 times a day 90 tablet 0   No current facility-administered medications for this visit.     Neurologic: Headache: Negative Seizure: Negative Paresthesias:Negative  Musculoskeletal: Strength & Muscle Tone: within normal limits Gait & Station: normal Patient leans: N/A  Psychiatric Specialty Exam: ROS  Blood pressure 116/68, pulse (!) 46, height 6\' 2"  (1.88 m), weight 69.2 kg (152 lb 9.6 oz).Body mass index is 19.59 kg/m.  General Appearance: Casual and Fairly Groomed  Eye Contact:  Good  Speech:  Clear and Coherent  Volume:  Normal  Mood:  Anxious and Depressed  Affect:  Appropriate and Congruent  Thought Process:  Goal Directed  Orientation:  Full (Time, Place, and Person)  Thought Content:  Rumination  Suicidal Thoughts:  No  Homicidal Thoughts:  No  Memory:  Immediate;   Good  Judgement:  Fair  Insight:  Shallow  Psychomotor Activity:  Normal  Concentration:  Concentration: Fair  Recall:  Good  Fund of Knowledge:Good   Language: Good  Akathisia:  Negative  Handed:  Right  AIMS (if indicated):  0  Assets:  Communication Skills Desire for Improvement Financial Resources/Insurance Housing Leisure Time Transportation Vocational/Educational  ADL's:  Intact  Cognition: WNL  Sleep:  6-8 hours    Treatment Plan Summary: Steve Hodge is a 21 year old male with a history of depression who presents today for psychiatric intake assessment. [facility]  He does not present with any acute safety issues, but he does present with ongoing depressive symptoms. He was recently titrated to Celexa 40 mg, and continues on clonazepam 3 times a day. I'd like to start tapering clonazepam with him, given the habit-forming nature of this medication, but in order to do so, I believe that he would benefit from a more potent antidepressant regimen.  I have discussed Abilify with him, including the risks and benefits, and recommended that we initiate this medication to augment his antidepressant therapies. I also believe that atypical antipsychotic would help reduce some of his perseveration and excessive symptoms, and help him to be able to govern some of his obsessive  thoughts about his ex-girlfriend. He does not present with any violent ideations or unsafe thoughts. He frankly continues to grieve the abrupt termination of his relationship with his ex-girlfriend from 18 months ago. He has good social support from some friends locally, and has been going on dates with other individuals, but continues to feel quite stuck in his past.  He continues to work with an Building services engineer on forward thinking goals.  1. Severe recurrent major depression without psychotic features (HCC)    Continue Celexa 40 mg daily Clonazepam 1 mg 3 times a day; I encouraged him to use a half a tablet ( 0.5 mg) when possible instead of the whole tablet Initiate Abilify 2 mg daily; reviewed the risks and benefits and  black box warning Follow-up in 1 month Continue in individual therapy   Burnard Leigh, MD 6/15/201811:01 AM

## 2016-11-21 ENCOUNTER — Other Ambulatory Visit (HOSPITAL_COMMUNITY): Payer: Self-pay

## 2016-11-22 ENCOUNTER — Ambulatory Visit (HOSPITAL_COMMUNITY): Payer: Self-pay

## 2016-11-22 ENCOUNTER — Other Ambulatory Visit (HOSPITAL_COMMUNITY): Payer: Self-pay

## 2016-11-23 ENCOUNTER — Other Ambulatory Visit (HOSPITAL_COMMUNITY): Payer: Self-pay

## 2016-11-24 ENCOUNTER — Ambulatory Visit (HOSPITAL_COMMUNITY): Payer: Self-pay

## 2016-11-24 ENCOUNTER — Other Ambulatory Visit (HOSPITAL_COMMUNITY): Payer: Self-pay

## 2016-11-25 ENCOUNTER — Other Ambulatory Visit (HOSPITAL_COMMUNITY): Payer: Self-pay

## 2016-11-29 ENCOUNTER — Ambulatory Visit (HOSPITAL_COMMUNITY): Payer: Self-pay

## 2016-12-01 ENCOUNTER — Ambulatory Visit (HOSPITAL_COMMUNITY): Payer: Self-pay

## 2016-12-01 ENCOUNTER — Telehealth (HOSPITAL_COMMUNITY): Payer: Self-pay | Admitting: Professional

## 2017-01-09 ENCOUNTER — Ambulatory Visit (HOSPITAL_COMMUNITY): Payer: BC Managed Care – PPO | Admitting: Psychiatry

## 2017-01-19 ENCOUNTER — Telehealth: Payer: Self-pay | Admitting: Cardiology

## 2017-01-19 NOTE — Telephone Encounter (Signed)
[facility] 02/01/17 with Dr Herbie BaltimoreHarding.  Records put with Dr Elissa HeftyHarding's schedule for 02/01/17. lp

## 2017-01-26 ENCOUNTER — Ambulatory Visit (HOSPITAL_COMMUNITY): Payer: BC Managed Care – PPO | Admitting: Psychiatry

## 2017-02-01 ENCOUNTER — Encounter: Payer: Self-pay | Admitting: Cardiology

## 2017-02-01 ENCOUNTER — Ambulatory Visit (INDEPENDENT_AMBULATORY_CARE_PROVIDER_SITE_OTHER): Payer: BC Managed Care – PPO | Admitting: Cardiology

## 2017-02-01 VITALS — BP 114/66 | HR 59 | Ht 74.0 in | Wt 141.0 lb

## 2017-02-01 DIAGNOSIS — R55 Syncope and collapse: Secondary | ICD-10-CM | POA: Diagnosis not present

## 2017-02-01 DIAGNOSIS — R0789 Other chest pain: Secondary | ICD-10-CM | POA: Insufficient documentation

## 2017-02-01 DIAGNOSIS — R011 Cardiac murmur, unspecified: Secondary | ICD-10-CM

## 2017-02-01 NOTE — Progress Notes (Signed)
PCP: Patient, No Pcp Per  Clinic Note: Chief Complaint  Patient presents with  . New Patient (Initial Visit)    chest pain(throbbing), and bradycardia, lightheaded and dizziness, syncope    HPI: Steve Hodge is a 21 y.o. male who is being seen today for the evaluation of a prolonged episode of chest pain & bradycardia at the request of Steve Nap, PA from [facility] (he is currently starting a "gap year" between year 3 & 4 of college in order to get work experience & network).  Steve Hodge was last seen on 8/13 at the [facility] - ostensibly for a medication refill & mentioned a prolonged "chest pain attack" that actually occurred in late July.    Recent Hospitalizations: nA  Studies Personally Reviewed - (if available, images/films reviewed: From Epic Chart or Care Everywhere)  n/a  Interval History: Steve Hodge presents today for evaluation of a chest pain attack that is a complicated scenario.  The entire day was a bit unusual for him starting from the fact that he did not sleep well the night before & was awakened much earlier than usual by a friend who wanted to go to breakfast.   He ate much larger than usual breakfast & had several cups of coffee (not usual for him) to stay awake. When he got back home, he had more coffee followed by a Coke in the early afternoon.  He had several hours to go before meeting up with friends to go out that night & did not eat or drink much else for the next several hours.  In his usual manner, he began pacing back & forth in his apartment thinking about an important activity that he was planning.   He then had a Monster Drink because he was starting to feel tired after having woken up "so early".  Shortly after that, he started to feel dizzy & lightheaded. He felt jittery all over, as became nauseated.  (he recall that he started feeling the dizziness sitting up, but it did not improve lying down.  He also started  having a headache off & on during the day & took at least 4 Excedrin tabs).  He started to feel like he could not breathe.  He was worried about this potentially being a a panic attack, but he had no frame of reference. He then started to feel a sharp stabbing ~10/10 it then "morphed" into a dull aching pressure along the left parasternal area (pointed with is hands).  The pain was several minutes into the syndrome, but quickly became the focal point along with dyspnea.  This pain became too onerous that he actually aused him to double over & almost pass out.  He called a friend during the episode who started to "talk him down".  He was finally able to lay down & rest for a few hours to allow the symptoms to fully wear off.    When the pain finally resolved, he was able to take all of his meds.  He has not had any other episodes since that particular episode, but has noted intermittent chest fleeting symptoms but nothing like he had before. Now all he will have his times where he feels lightheaded or dizzy.  No No PND, orthopnea or edema. No lightheadedness, dizziness, weakness or syncope/near syncope. No TIA/amaurosis fugax symptoms. No melena, hematochezia, hematuria, or epstaxis. No claudication.  ROS: A comprehensive was performed.  If not noted above, not signficant.  Review of Systems  Constitutional: Negative for malaise/fatigue.  HENT: Negative for congestion.   Respiratory: Positive for shortness of breath.   Gastrointestinal: Negative for blood in stool and melena.  Genitourinary: Negative for hematuria.  Musculoskeletal: Negative.   Neurological: Positive for dizziness.  Endo/Heme/Allergies: Positive for environmental allergies.  Psychiatric/Behavioral: Negative for depression. The patient is nervous/anxious.   All other systems reviewed and are negative.   I have reviewed and (if needed) personally updated the patient's problem list, medications, allergies, past medical and  surgical history, social and family history.   Past Medical History:  Diagnosis Date  . Anxiety   . Eczema   . HSV-1 infection     Past Surgical History:  Procedure Laterality Date  . TONSILLECTOMY      Current Meds  Medication Sig  . citalopram (CELEXA) 40 MG tablet Take 1 tablet (40 mg total) by mouth daily.  . clonazePAM (KLONOPIN) 1 MG tablet Take 1 tablet (1 mg total) by mouth 3 (three) times daily as needed for anxiety. Take 1/2 or 1 tablet 3 times a day  . valACYclovir (VALTREX) 500 MG tablet Take 2,000 mg by mouth 2 (two) times daily.    Allergies  Allergen Reactions  . Amoxicillin     Had childhood reaction    Social History   Social History  . Marital status: Single    Spouse name: N/A  . Number of children: N/A  . Years of education: N/A   Social History Main Topics  . Smoking status: Never Smoker  . Smokeless tobacco: Never Used  . Alcohol use No     Comment: Occasioal use  . Drug use: No  . Sexual activity: Not Currently   Other Topics Concern  . None   Social History Narrative  . None    family history includes Alcohol abuse in his brother and father; Drug abuse in his brother; Gout in his brother; High blood pressure in his brother, father, and mother; OCD in his paternal uncle; Physical abuse in his brother, father, and sister.  Wt Readings from Last 3 Encounters:  02/01/17 141 lb (64 kg)    PHYSICAL EXAM BP 114/66 (BP Location: Left Arm, Patient Position: Sitting, Cuff Size: Normal)   Pulse (!) 59   Ht 6\' 2"  (1.88 m)   Wt 141 lb (64 kg)   BMI 18.10 kg/m  Physical Exam  Constitutional: He is oriented to person, place, and time. He appears well-developed and well-nourished. No distress.  Tall thin young man - anxious, but in no acute discomfort.  Does not have Marfan's type features.  Neck: Normal range of motion. Neck supple. Normal carotid pulses, no hepatojugular reflux and no JVD present. Carotid bruit is not present.    Cardiovascular: Regular rhythm, S1 normal and S2 normal.   Occasional extrasystoles are present. Bradycardia present.  PMI is not displaced.  Exam reveals distant heart sounds and a midsystolic click. Exam reveals no gallop.   Pulmonary/Chest: He has no wheezes. He has no rales. He exhibits no tenderness.  Abdominal: Soft. Bowel sounds are normal. He exhibits no distension. There is no tenderness. There is no rebound and no guarding.  Musculoskeletal: Normal range of motion. He exhibits no edema or deformity.  Neurological: He is alert and oriented to person, place, and time. He has normal reflexes. Coordination normal.  Skin: Skin is warm and dry. No rash noted. No erythema. No pallor.  Psychiatric: He has a normal mood and affect. His behavior is  normal. Judgment and thought content normal.     Adult ECG Report  Rate: 59;  Rhythm: barely sinus bradycardia; CRO Anterior MI, age undetermined.  Repolarization abnormalities noted.  Narrative Interpretation: abnormal EKG with bradycardia as well as repolarization.   Other studies Reviewed: Additional studies/ records that were reviewed today include:  Recent Labs:   Lab Results  Component Value Date   CREATININE 0.85 04/03/2015   BUN 13 04/03/2015   NA 139 04/03/2015   K 3.8 04/03/2015   CL 105 04/03/2015   CO2 24 04/03/2015    ASSESSMENT / PLAN: Problem List Items Addressed This Visit    Chest pressure - Primary    Prolonged episode chest pain and pressure in a relatively young gentleman with no significant medical history besides what seems like recent recurrence  Substernal chest pain associated with extensive caffeine and Excedrin use.he did note a little bit of exertion change with what seemed like worsening of the symptoms and broadening the distribution.  I think the chest pain is probably noncardiac initial management. He could have coronary spasm hat could potentially been triggered by excess caffeine in the form of coffee,  Coke or candy.  Plan will be to check a 2-D echo for chest pain to look for wall motion abnormalities or diastolic dysfunction.  Is symptoms really does not sound anginal in nature just based on how this described.        Relevant Orders   EKG 12-Lead (Completed)   ECHOCARDIOGRAM COMPLETE   Near syncope    Related to pain      Relevant Orders   EKG 12-Lead (Completed)   ECHOCARDIOGRAM COMPLETE   Systolic murmur    He does have a systolic murmur on exam, not very loud.Probably just an innocent flow murmur, however we will check an echo just to exclude any abnormalities given his age.  Plan: 2-D echo      Relevant Orders   EKG 12-Lead (Completed)   ECHOCARDIOGRAM COMPLETE      Current medicines are reviewed at length with the patient today. (+/- concerns) n/a The following changes have been made: n/a  Patient Instructions  SCHEDULE AT [address] Your physician has requested that you have an echocardiogram. Echocardiography is a painless test that uses sound waves to create images of your heart. It provides your doctor with information about the size and shape of your heart and how well your heart's chambers and valves are working. This procedure takes approximately one hour. There are no restrictions for this procedure.    NO CHANGE MEDICATIONS   Your physician recommends that you schedule a follow-up appointment in 1 MONTH WITH DR Margurite Duffy.    Studies Ordered:   Orders Placed This Encounter  Procedures  . EKG 12-Lead  . ECHOCARDIOGRAM COMPLETE      Bryan Lemmaavid Ariza Evans, M.D., M.S. Interventional Cardiologist   Pager # [phone] Phone # [phone] [address] [place], [place] [address]

## 2017-02-01 NOTE — Patient Instructions (Signed)
SCHEDULE AT [address] Your physician has requested that you have an echocardiogram. Echocardiography is a painless test that uses sound waves to create images of your heart. It provides your doctor with information about the size and shape of your heart and how well your heart's chambers and valves are working. This procedure takes approximately one hour. There are no restrictions for this procedure.    NO CHANGE MEDICATIONS   Your physician recommends that you schedule a follow-up appointment in 1 MONTH WITH DR HARDING.

## 2017-02-04 ENCOUNTER — Encounter: Payer: Self-pay | Admitting: Cardiology

## 2017-02-04 NOTE — Assessment & Plan Note (Addendum)
Prolonged episode chest pain and pressure in a relatively young gentleman with no significant medical history besides what seems like recent recurrence  Substernal chest pain associated with extensive caffeine and Excedrin use.he did note a little bit of exertion change with what seemed like worsening of the symptoms and broadening the distribution.  I think the chest pain is probably noncardiac initial management. He could have coronary spasm hat could potentially been triggered by excess caffeine in the form of coffee, Coke or candy.  Plan will be to check a 2-D echo for chest pain to look for wall motion abnormalities or diastolic dysfunction.  Is symptoms really does not sound anginal in nature just based on how this described.

## 2017-02-04 NOTE — Assessment & Plan Note (Signed)
Related to pain

## 2017-02-04 NOTE — Assessment & Plan Note (Addendum)
He does have a systolic murmur on exam, not very loud.Probably just an innocent flow murmur, however we will check an echo just to exclude any abnormalities given his age.  Plan: 2-D echo

## 2017-02-08 ENCOUNTER — Other Ambulatory Visit: Payer: Self-pay

## 2017-02-08 ENCOUNTER — Ambulatory Visit (HOSPITAL_COMMUNITY): Payer: BC Managed Care – PPO | Attending: Cardiology

## 2017-02-08 DIAGNOSIS — R0789 Other chest pain: Secondary | ICD-10-CM | POA: Diagnosis not present

## 2017-02-08 DIAGNOSIS — R55 Syncope and collapse: Secondary | ICD-10-CM | POA: Insufficient documentation

## 2017-02-08 DIAGNOSIS — R011 Cardiac murmur, unspecified: Secondary | ICD-10-CM | POA: Diagnosis not present

## 2017-02-09 NOTE — Progress Notes (Signed)
Echo results: Good news: Essentially normal echocardiogram and normal pump function and normal valve function.EF: 55-60%. No regional wall motion abnormalities =   No signs to suggest heart attack  David Harding, MD    

## 2017-02-13 ENCOUNTER — Telehealth (HOSPITAL_COMMUNITY): Payer: Self-pay

## 2017-02-13 DIAGNOSIS — F332 Major depressive disorder, recurrent severe without psychotic features: Secondary | ICD-10-CM

## 2017-02-13 MED ORDER — CLONAZEPAM 1 MG PO TABS: ORAL_TABLET | ORAL | 0 refills | Status: DC

## 2017-02-13 MED ORDER — CITALOPRAM HYDROBROMIDE 40 MG PO TABS: 40.0000 mg | ORAL_TABLET | Freq: Every day | ORAL | 1 refills | Status: DC

## 2017-02-13 NOTE — Telephone Encounter (Signed)
Patient has rescheduled his appointment to the end of the month, he would like to know if he can get a refill on meds. Please review and advise, thank you

## 2017-02-13 NOTE — Telephone Encounter (Signed)
Yes that's fine we can send refills

## 2017-02-24 NOTE — Progress Notes (Signed)
Labs from August 2018: Na+ 141, K+ 4.6, Cl- 108, HCO3- 29 , BUN 9, Cr 1.05, Glu 82, Ca2+ 9.5 CBC: W 7.1, H/H 16/478, Plt 358  DH

## 2017-03-02 ENCOUNTER — Ambulatory Visit (INDEPENDENT_AMBULATORY_CARE_PROVIDER_SITE_OTHER): Payer: BC Managed Care – PPO | Admitting: Psychiatry

## 2017-03-02 ENCOUNTER — Encounter (HOSPITAL_COMMUNITY): Payer: Self-pay | Admitting: Psychiatry

## 2017-03-02 VITALS — BP 117/74 | HR 84 | Temp 98.5°F | Resp 17 | Ht 75.0 in | Wt 145.8 lb

## 2017-03-02 DIAGNOSIS — F329 Major depressive disorder, single episode, unspecified: Secondary | ICD-10-CM

## 2017-03-02 DIAGNOSIS — F341 Dysthymic disorder: Secondary | ICD-10-CM | POA: Diagnosis not present

## 2017-03-02 DIAGNOSIS — G8929 Other chronic pain: Secondary | ICD-10-CM

## 2017-03-02 DIAGNOSIS — F428 Other obsessive-compulsive disorder: Secondary | ICD-10-CM

## 2017-03-02 DIAGNOSIS — R45851 Suicidal ideations: Secondary | ICD-10-CM

## 2017-03-02 DIAGNOSIS — Z79899 Other long term (current) drug therapy: Secondary | ICD-10-CM | POA: Diagnosis not present

## 2017-03-02 DIAGNOSIS — F332 Major depressive disorder, recurrent severe without psychotic features: Secondary | ICD-10-CM

## 2017-03-02 MED ORDER — CLONAZEPAM 0.5 MG PO TABS: 0.5000 mg | ORAL_TABLET | Freq: Three times a day (TID) | ORAL | 1 refills | Status: DC

## 2017-03-02 MED ORDER — CLOMIPRAMINE HCL 25 MG PO CAPS: 25.0000 mg | ORAL_CAPSULE | Freq: Every day | ORAL | 2 refills | Status: DC

## 2017-03-02 NOTE — Progress Notes (Signed)
BH MD/PA/NP OP Progress Note  03/02/2017 3:33 PM Ohm Dentler  MRN:  [id number]  Chief Complaint:  Chief Complaint    Follow-up     HPI: Camry Theiss reports that he continues to take clonazepam 1 mg twice a day for anxiety and panic. He reports that he continued on Celexa 40 mg for the past few months but never started the Abilify. He reports that his dad is a Warden/ranger, and took him to a friend to get testing for OCD see if he needed Abilify for his symptoms of "OCD."  It's unclear how exactly this came about, but the patient reports that he had told his dad Abilify was added to help with his obsessive tendencies. I spent time clarifying with the patient that we added Abilify to help augment his antidepressant regimen and to help with symptoms of severe obsessive and rumination, which antipsychotics can be quite effective for. He reports that he has a lot of apprehension about being on an antipsychotic, and would rather discuss other medication options.  I spent time with him learning about some of his recent stressors. He spent much of our time talking about a new relationship with an individual who lives near him and is around his same age. He reports that he went on a few dates with this young lady and has formed a friendship, but there is been ambivalence from her and about wanting to start a relationship. He spends much of the session ruminating over their encounters. He reflects on poor self-esteem, sense of poor self-worth. He reports that he continues to feel very depressed and discouraged. He reports that he has applied for 30-40 jobs and has not gotten a job yet. 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  He continues to reflect on how he feels unhappy and dissatisfied in his life. He is taking a break from school because he feels like he needs to focus on  his mood. His individual therapist that he is seen for the past 2 years has told him that she will be transferring his care to another provider because she feels like the severity of his depression and persistence of his depression is outside of her scope of expertise. He reports that he continues to have suicidal thoughts off and on, but denies any plan or intent at this time. With further questioning, he reports that he has a plan he has made for the past 4 years, but does not have any intention to carry this out. He reports that his plan is to overdose on helium, because this would be a peaceful death. I asked him what prevents him from going forward with this plan, and he reports that a sense of hope that he can accomplish some of the goals he wants to in life, and a feeling that he owes himself more time to see if he can get out of his depression.   Most recently, he discontinued Celexa 40 mg because of sexual side effects. He reports that he and his dating partner were sexually active together and he was unable to ejaculate after a significant length of time. This was quite frustrating and embarrassing for him. He reports that now the relationship is ended so he restarted Celexa this morning.  I spent time with him educating him about various psychiatric medication interventions that we can  make at this point. Asked that his symptoms of depression have persisted beyond 2 years, and his presentation is most consistent with a persistent depressive disorder, which indicates a high level of severity.  I spent time with him educating him about SNRI medications, try cyclic medications, and MAOI. We also discussed the potential for ECT for treatment resistant depression. Given the severity of his symptoms, and his continued struggles in sleeping and anxiety, we discussed initiating clomipramine 25 mg nightly and increasing in one week 50 mg nightly. Spent time educating him about the risks and benefits, including  the signs and symptoms of serotonin syndrome, and to discontinue the medication if he was to notice these effects, and to seek emergency medical care. I also spent time recommending that if he does not have a robust response in the coming weeks from clomipramine, I would like him to go for an ECT consultation. He was agreeable to this recommendation.  He will be starting therapy in the coming weeks with a new individual therapist that his current therapist is arranged for him.. He agrees to follow-up with this Clinical research associate in 6 weeks or contact the clinic if any issues arise in the meantime.  With regard to clonazepam, we agreed to decrease further to 0.5 mg twice daily, with an additional 0.5 mg daily as needed. The goal is to gradually reduce and eventually taper off of clonazepam, especially if he is going to move forward with ECT.  He does not have any medical contributors. He had a recent episode of syncope and substernal chest pain approximately one month ago in the setting of excessive ingestion of caffeine in Excedrin. Discussed was likely related to medication, and he has been cleared by cardiology, normal echo.  Visit Diagnosis:    ICD-10-CM   1. Persistent depressive disorder F34.1 clomiPRAMINE (ANAFRANIL) 25 MG capsule  2. 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 Depression with suicidal ideation F32.9    R45.851   4. 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 Reviewed, with no updates at this time.   Past Medical History:  Past Medical History:  Diagnosis Date  . Anxiety   . Eczema   . HSV-1 infection     Past Surgical History:  Procedure Laterality Date  . TONSILLECTOMY      Family Psychiatric History: See intake H&P for full details. Reviewed, with no updates at this time.   Family History:  Family History  Problem Relation Age of Onset  . Alcohol abuse  Father   . Physical abuse Father   . High blood pressure Father   . Physical abuse Sister   . Alcohol abuse Brother   . Drug abuse Brother   . Physical abuse Brother   . High blood pressure Brother   . Gout Brother   . OCD Paternal Uncle   . High blood pressure Mother     Social History:  Social History   Social History  . Marital status: Single    Spouse name: N/A  . Number of children: N/A  . Years of education: N/A   Social History Main Topics  . Smoking status: Never Smoker  . Smokeless tobacco: Never Used  . Alcohol use No     Comment: Occasioal use  . Drug use: No  . Sexual activity: Yes    Birth control/ protection: Condom, Pill   Other Topics Concern  .  None   Social History Narrative  . None    Allergies:  Allergies  Allergen Reactions  . Amoxicillin     Had childhood reaction    Metabolic Disorder Labs: No results found for: HGBA1C, MPG No results found for: PROLACTIN No results found for: CHOL, TRIG, HDL, CHOLHDL, VLDL, LDLCALC No results found for: TSH  Therapeutic Level Labs: No results found for: LITHIUM No results found for: VALPROATE No components found for:  CBMZ  Current Medications: Current Outpatient Prescriptions  Medication Sig Dispense Refill  . clonazePAM (KLONOPIN) 0.5 MG tablet Take 1 tablet (0.5 mg total) by mouth 3 (three) times daily. Take 1 tablet twice a day 90 tablet 1  . valACYclovir (VALTREX) 500 MG tablet Take 2,000 mg by mouth 2 (two) times daily.    . clomiPRAMINE (ANAFRANIL) 25 MG capsule Take 1 capsule (25 mg total) by mouth at bedtime. Take 1 capsule nightly for 1 week, then increase to 2 capsules nightly 30 capsule 2   No current facility-administered medications for this visit.      Musculoskeletal: Strength & Muscle Tone: within normal limits Gait & Station: normal Patient leans: N/A  Psychiatric Specialty Exam: Review of Systems  Constitutional: Negative.   HENT: Negative.   Respiratory: Negative.    Cardiovascular: Negative.   Gastrointestinal: Negative.   Musculoskeletal: Negative.   Neurological: Negative.   Psychiatric/Behavioral: Positive for depression and suicidal ideas. The patient is nervous/anxious and has insomnia.     Blood pressure 117/74, pulse 84, temperature 98.5 F (36.9 C), temperature source Oral, resp. rate 17, height  (1.905 m), weight 145 lb 12.8 oz (66.1 kg), SpO2 98 %.Body mass index is 18.22 kg/m.  General Appearance: Casual and Well Groomed  Eye Contact:  Good  Speech:  Clear and Coherent  Volume:  Normal  Mood:  Anxious, Depressed and Dysphoric  Affect:  Congruent  Thought Process:  Goal Directed  Orientation:  Full (Time, Place, and Person)  Thought Content: Logical   Suicidal Thoughts:  Yes.  without intent/plan (with chronic plan of 4 years duration)  Homicidal Thoughts:  No  Memory:  Immediate;   Fair  Judgement:  Intact  Insight:  Shallow  Psychomotor Activity:  Normal  Concentration:  Concentration: Fair  Recall:  [employer] of Knowledge: Fair  Language: Fair  Akathisia:  Negative  Handed:  Right  AIMS (if indicated): not done  Assets:  Communication Skills Desire for Improvement Financial Resources/Insurance Housing Leisure Time Physical Health Social Support Transportation Vocational/Educational  ADL's:  Intact  Cognition: WNL  Sleep:  Fair   Screenings: AIMS     [facility]  AIMS Total Score  0    AUDIT     Ad[facility] ([facility]  Alcohol Use Disorder Identification Test Final Score (AUDIT)  0    GAD-7     Counselor from 11/11/2016 [facility] Counselor from 10/27/2016 in [facility]  Total GAD-7 Score  6  11    PHQ2-9     Counselor from 11/11/2016 in [facility]  Counselor from 10/27/2016 in [facility]  PHQ-2 Total Score  4  3  PHQ-9 Total Score  14  9       Assessment and Plan: Marty Uy is a 21 year old male with a psychiatric history most consistent with persistent depressive disorder,  which has continued on for over 2 years. He has been tried on Celexa and Trintellix in the past without any significant benefit. Clonazepam has been effective for his anxiety symptoms, but given its habit-forming nature, we have been gradually reducing his dose. With regard to his ongoing depressive symptoms, he has harbored feelings of suicidality for the past 4 years, and has had a plan of how he would commit this act, specifically through inhalation of large volumes of helium. He denies any intention to move forward with this plan, and attributes his ambivalence to a sense of hope that his mood will improve, and he will be able to accomplish some of the life goals he has for himself both professionally and personally. Much of what he presents with his concerning for characterologic and cluster B personality features. This is difficult to fully ascertain in the setting of ongoing uncontrolled depressive symptoms. We have opted for a more aggressive treatment with clomipramine, and if he does not respond or begin to have a response in the coming weeks, I have recommended that we proceed with ECT consultation. He does not present as acutely psychotic or manic, or acutely suicidal. He has no past history of self-harm. We will proceed as below and follow up in 6 weeks.  1. Persistent depressive disorder   2. Severe recurrent major depression without psychotic features (HCC)   3. Depression with suicidal ideation   4. Obsessive thinking    Patient self discontinued Celexa 1 week ago and then restarted today. I recommended that he discontinue and we move forward with a tricyclic antidepressant Initiate clomipramine 25 mg nightly,  increase to 50 mg nightly in 1 week I educated the patient on the risks and benefits trycyclics including the potential for serotonin syndrome Decrease clonazepam to 0.5 mg twice daily, with additional 0.5 mg as needed for anxiety or panic Follow-up in individual therapy, patient is currently in the process of transferring to a new therapist Referral for ECT consultation with Dr. Toni Amend' team  Burnard Leigh, MD 03/02/2017, 3:33 PM

## 2017-03-03 ENCOUNTER — Ambulatory Visit: Payer: BC Managed Care – PPO | Admitting: Cardiology

## 2017-03-15 ENCOUNTER — Encounter (HOSPITAL_COMMUNITY): Payer: Self-pay | Admitting: Emergency Medicine

## 2017-03-15 ENCOUNTER — Ambulatory Visit (HOSPITAL_COMMUNITY)
Admission: EM | Admit: 2017-03-15 | Discharge: 2017-03-15 | Disposition: A | Discharge status: Home / Self Care | Payer: BC Managed Care – PPO | Attending: Family Medicine | Admitting: Family Medicine

## 2017-03-15 DIAGNOSIS — L309 Dermatitis, unspecified: Secondary | ICD-10-CM

## 2017-03-15 DIAGNOSIS — L720 Epidermal cyst: Secondary | ICD-10-CM

## 2017-03-15 DIAGNOSIS — B009 Herpesviral infection, unspecified: Secondary | ICD-10-CM

## 2017-03-15 MED ORDER — VALACYCLOVIR HCL 500 MG PO TABS: 2000.0000 mg | ORAL_TABLET | Freq: Two times a day (BID) | ORAL | 0 refills | Status: DC

## 2017-03-15 MED ORDER — HYDROCORTISONE 1 % EX CREA: TOPICAL_CREAM | CUTANEOUS | 0 refills | Status: DC

## 2017-03-15 NOTE — ED Provider Notes (Signed)
  [facility]   [id number] 03/15/17 Arrival Time: 1321  ASSESSMENT & PLAN:  1. Epidermoid cyst of ear   2. Eczema, unspecified type   3. HSV-1 infection     Meds ordered this encounter  Medications  . hydrocortisone cream 1 %    Sig: Apply to affected area 2 times daily    Dispense:  15 g    Refill:  0  . valACYclovir (VALTREX) 500 MG tablet    Sig: Take 4 tablets (2,000 mg total) by mouth 2 (two) times daily.    Dispense:  20 tablet    Refill:  0   If ear cyst continues to bother him he will schedule ENT evaluation. Information given. Refilled meds above. May f/u here as needed.  Reviewed expectations re: course of current medical issues. Questions answered. Outlined signs and symptoms indicating need for more acute intervention. Patient verbalized understanding. After Visit Summary given.   SUBJECTIVE:  Steve Hodge is a 21 y.o. male who presents with complaint of noticing swelling of his L ear last evening. No pain. No h/o similar. No ear injury. No self treatment. Otherwise well.  Requests refill of Valtrex and cream for eczema on face.  ROS: As per HPI.  OBJECTIVE: Vitals:   03/15/17 1336 03/15/17 1338  BP:  111/71  Pulse:  65  Resp:  16  Temp:  98.2 F (36.8 C)  TempSrc:  Oral  SpO2:  99%  Weight: 143 lb (64.9 kg)   Height:  (1.88 m)     General appearance: alert; no distress Extremities: no edema Skin: epidermoid cyst of inferior crus of antihelix on R ear; non-tender; no inflammation; no drainage Psychological: alert and cooperative; normal mood and affect   Allergies  Allergen Reactions  . Amoxicillin     Had childhood reaction    Past Medical History:  Diagnosis Date  . Anxiety   . Eczema   . HSV-1 infection    Social History   Social History  . Marital status: Single    Spouse name: N/A  . Number of children: N/A  . Years of education: N/A   Occupational History  . Not on file.   Social History Main Topics   . Smoking status: Never Smoker  . Smokeless tobacco: Never Used  . Alcohol use No     Comment: Occasioal use  . Drug use: No  . Sexual activity: Yes    Birth control/ protection: Condom, Pill   Other Topics Concern  . Not on file   Social History Narrative  . No narrative on file   Family History  Problem Relation Age of Onset  . Alcohol abuse Father   . Physical abuse Father   . High blood pressure Father   . Physical abuse Sister   . Alcohol abuse Brother   . Drug abuse Brother   . Physical abuse Brother   . High blood pressure Brother   . Gout Brother   . OCD Paternal Uncle   . High blood pressure Mother    Past Surgical History:  Procedure Laterality Date  . Greig Right, MD 03/15/17 1422

## 2017-03-15 NOTE — ED Triage Notes (Signed)
PT has a swollen area over right outer ear. First noticed it last night. PT reports no pain.

## 2017-04-19 ENCOUNTER — Encounter (HOSPITAL_COMMUNITY): Payer: Self-pay | Admitting: Psychiatry

## 2017-04-19 ENCOUNTER — Ambulatory Visit (HOSPITAL_COMMUNITY): Payer: BC Managed Care – PPO | Admitting: Psychiatry

## 2017-04-19 VITALS — BP 120/72 | HR 62 | Ht 74.0 in | Wt 147.0 lb

## 2017-04-19 DIAGNOSIS — F341 Dysthymic disorder: Secondary | ICD-10-CM

## 2017-04-19 DIAGNOSIS — Z79899 Other long term (current) drug therapy: Secondary | ICD-10-CM | POA: Diagnosis not present

## 2017-04-19 DIAGNOSIS — Z818 Family history of other mental and behavioral disorders: Secondary | ICD-10-CM | POA: Diagnosis not present

## 2017-04-19 DIAGNOSIS — Z621 Parental overprotection: Secondary | ICD-10-CM

## 2017-04-19 DIAGNOSIS — F428 Other obsessive-compulsive disorder: Secondary | ICD-10-CM

## 2017-04-19 DIAGNOSIS — Z813 Family history of other psychoactive substance abuse and dependence: Secondary | ICD-10-CM | POA: Diagnosis not present

## 2017-04-19 MED ORDER — CLOMIPRAMINE HCL 50 MG PO CAPS: 100.0000 mg | ORAL_CAPSULE | Freq: Every day | ORAL | 3 refills | Status: DC

## 2017-04-19 NOTE — Progress Notes (Signed)
BH MD/PA/NP OP Progress Note  04/19/2017 11:10 AM Steve Hodge  MRN:  [id number]  Chief Complaint: depression, OCD  HPI: Steve Hodge feels dramatically improved from last visit.  He reports that he continues to have some mild depressive symptoms and some anxiety.  He had dated a young woman about 2 months ago and it ended after a few dates.  He was ruminative about this for very few days and able to untether himself from this and focus on his goals.  He reports that he is sleeping much better at night.  He reports that he is now only using clonazepam about 2-3 times a week, as compared to requiring 3 times a day.  He denies any suicidal thoughts.  He has established therapy with a new psychologist, and they are working on being able to set limits with his overly controlling parents.  He reports that he has not had any significant side effects from clomipramine, and we agreed to increase to 100 mg daily for a maintenance dose.  I educated him that the maximum dose is 250 mg, so we have room to increase if needed, but I am hopeful that he will have significant improvement in maintenance at 100 mg.  Discussed that there is no longer any indication to proceed with ECT, but we may revisit it in the future if necessary.  He agrees to follow-up in 3 months or sooner if needed.  Visit Diagnosis:    ICD-10-CM   1. Persistent depressive disorder F34.1 clomiPRAMINE (ANAFRANIL) 50 MG capsule  2. 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 Reviewed, with no updates at this time.  Past Medical History:  Past Medical History:  Diagnosis Date  . Anxiety   . Eczema   . HSV-1 infection     Past Surgical History:  Procedure Laterality Date  . TONSILLECTOMY      Family Psychiatric History: See intake H&P for full details. Reviewed, with no updates at this time.   Family History:  Family History  Problem Relation Age of Onset  . Alcohol abuse Father   .  Physical abuse Father   . High blood pressure Father   . Physical abuse Sister   . Alcohol abuse Brother   . Drug abuse Brother   . Physical abuse Brother   . High blood pressure Brother   . Gout Brother   . OCD Paternal Uncle   . High blood pressure Mother     Social History:  Social History   Socioeconomic History  . Marital status: Single    Spouse name: None  . Number of children: None  . Years of education: None  . Highest education level: None  Social Needs  . Financial resource strain: Patient refused  . Food insecurity - worry: Patient refused  . Food insecurity - inability: Patient refused  . Transportation needs - medical: Patient refused  . Transportation needs - non-medical: Patient refused  Occupational History  . None  Tobacco Use  . Smoking status: Never Smoker  . Smokeless tobacco: Never Used  Substance and Sexual Activity  . Alcohol use: No    Comment: Occasioal use  . Drug use: No  . Sexual activity: Yes    Birth control/protection: Condom, Pill  Other Topics Concern  . None  Social History Narrative  . None    Allergies:  Allergies  Allergen Reactions  . Amoxicillin     Had childhood reaction  Metabolic Disorder Labs: No results found for: HGBA1C, MPG No results found for: PROLACTIN No results found for: CHOL, TRIG, HDL, CHOLHDL, VLDL, LDLCALC No results found for: TSH  Therapeutic Level Labs: No results found for: LITHIUM No results found for: VALPROATE No components found for:  CBMZ  Current Medications: Current Outpatient Medications  Medication Sig Dispense Refill  . clonazePAM (KLONOPIN) 0.5 MG tablet Take 1 tablet (0.5 mg total) by mouth 3 (three) times daily. Take 1 tablet twice a day 90 tablet 1  . hydrocortisone cream 1 % Apply to affected area 2 times daily 15 g 0  . valACYclovir (VALTREX) 500 MG tablet Take 4 tablets (2,000 mg total) by mouth 2 (two) times daily. 20 tablet 0  . clomiPRAMINE (ANAFRANIL) 50 MG capsule  Take 2 capsules (100 mg total) at bedtime by mouth. 60 capsule 3   No current facility-administered medications for this visit.      Musculoskeletal: Strength & Muscle Tone: within normal limits Gait & Station: normal Patient leans: N/A  Psychiatric Specialty Exam: ROS  Blood pressure 120/72, pulse 62, height 6\' 2"  (1.88 m), weight 147 lb (66.7 kg).Body mass index is 18.87 kg/m.  General Appearance: Casual and Well Groomed  Eye Contact:  Good  Speech:  Clear and Coherent  Volume:  Normal  Mood:  Euthymic  Affect:  Appropriate and Congruent  Thought Process:  Coherent, Goal Directed and Descriptions of Associations: Intact  Orientation:  Full (Time, Place, and Person)  Thought Content: Logical   Suicidal Thoughts:  No  Homicidal Thoughts:  No  Memory:  NA Immediate;   Good  Judgement:  Good  Insight:  Fair  Psychomotor Activity:  Normal  Concentration:  Attention Span: Good  Recall:  Good  Fund of Knowledge: Good  Language: Good  Akathisia:  Negative  Handed:  Right  AIMS (if indicated): not done  Assets:  Communication Skills Desire for Improvement Financial Resources/Insurance Housing Intimacy Leisure Time Physical Health Social Support Transportation Vocational/Educational  ADL's:  Intact  Cognition: WNL  Sleep:  Good   Screenings: AIMS     [facility]  AIMS Total Score  0    AUDIT     [facility]  Alcohol Use Disorder Identification Test Final Score (AUDIT)  0    GAD-7     Counselor from 11/11/2016 in [facility] Counselor from 10/27/2016 in [facility]  Total GAD-7 Score  6  11    PHQ2-9     Counselor from 11/11/2016 in [facility] Counselor from 10/27/2016 in [facility]  PHQ-2 Total Score  4  3  PHQ-9 Total Score  14  9       Assessment and Plan:  Steve Hodge is a 21 year old male with OCD and persistent depressive disorder, experiencing a positive response from clomipramine.  He would benefit from further titration as below, to help support his continued mood improvement and ongoing participation in therapy.  No acute safety issues or substance abuse.  We will follow-up in 3 months.  We spent time today specifically processing and discussing strategies to handle his fairly controlling parents, who tend to attempt to micromanage him.  He is also working on this in individual therapy.  1. Persistent depressive disorder   2. Obsessive thinking     Status of current problems:  gradually improving  Labs Ordered: No orders of the defined types were placed in this encounter.   Labs Reviewed: n/a  Collateral Obtained/Records Reviewed: n/a  Plan:  Increase clomipramine to 100 mg daily ( from 50) Clonazepam 2-3 times per week as needed Return to clinic in 3 months   I spent 25 minutes with the patient in direct face-to-face clinical care.  Greater than 50% of this time was spent in counseling and coordination of care with the patient.    Burnard Leigh, MD 04/19/2017, 11:10 AM

## 2017-06-20 ENCOUNTER — Telehealth (HOSPITAL_COMMUNITY): Payer: Self-pay

## 2017-06-20 DIAGNOSIS — F341 Dysthymic disorder: Secondary | ICD-10-CM

## 2017-06-20 NOTE — Telephone Encounter (Signed)
Patient called to ask for an increase in Clomipramine. He has an appointment on 2/11. Please review and advise, thank you

## 2017-06-21 MED ORDER — CLOMIPRAMINE HCL 75 MG PO CAPS: 150.0000 mg | ORAL_CAPSULE | Freq: Every day | ORAL | 2 refills | Status: DC

## 2017-06-21 NOTE — Telephone Encounter (Signed)
Yes we can increase to 150 mg total daily dose - it comes in a 75 mg dose, so he can either take BID or two at bedtime

## 2017-06-21 NOTE — Telephone Encounter (Signed)
New order sent to the pharmacy ans I called patient to give him the new instructions.

## 2017-07-17 ENCOUNTER — Encounter (HOSPITAL_COMMUNITY): Payer: Self-pay | Admitting: Psychiatry

## 2017-07-17 ENCOUNTER — Ambulatory Visit (INDEPENDENT_AMBULATORY_CARE_PROVIDER_SITE_OTHER): Payer: BC Managed Care – PPO | Admitting: Psychiatry

## 2017-07-17 DIAGNOSIS — Z811 Family history of alcohol abuse and dependence: Secondary | ICD-10-CM

## 2017-07-17 DIAGNOSIS — F422 Mixed obsessional thoughts and acts: Secondary | ICD-10-CM | POA: Diagnosis not present

## 2017-07-17 DIAGNOSIS — Z813 Family history of other psychoactive substance abuse and dependence: Secondary | ICD-10-CM

## 2017-07-17 DIAGNOSIS — Z79899 Other long term (current) drug therapy: Secondary | ICD-10-CM

## 2017-07-17 DIAGNOSIS — F332 Major depressive disorder, recurrent severe without psychotic features: Secondary | ICD-10-CM

## 2017-07-17 DIAGNOSIS — F341 Dysthymic disorder: Secondary | ICD-10-CM | POA: Diagnosis not present

## 2017-07-17 MED ORDER — CLONAZEPAM 0.5 MG PO TABS: 0.5000 mg | ORAL_TABLET | Freq: Two times a day (BID) | ORAL | 1 refills | Status: DC | PRN

## 2017-07-17 MED ORDER — CLOMIPRAMINE HCL 75 MG PO CAPS: 150.0000 mg | ORAL_CAPSULE | Freq: Every day | ORAL | 2 refills | Status: DC

## 2017-07-17 NOTE — Progress Notes (Signed)
BH MD/PA/NP OP Progress Note  07/17/2017 11:49 AM Steve Hodge  MRN:  [id number]  Chief Complaint:  med management follow-up  HPI: Steve Hodge presents today for med management follow-up.  He confirms that he is taking 150 mg of clomipramine daily in the morning.  He continues to use clonazepam approximately once a day.  He reports that he tends to use it for episodes of panic or anxiety and it seems to be effective.  Steve Hodge that he can reduce his need to use clonazepam as clomipramine further kicks in.  Patient continues to be predominantly occupied with the idea of diagnostic criteria and understanding his diagnosis.  He wonders about information regarding OCD.  I gave him a packet of information to review, but again cautioned him that this is a complex diagnosis, and not everything will apply to him.  He continues to be quite focused on intellectualizing and having concrete answers to things rather than being able to focus on his emotional experience.  I spent time with him considering some of his past relationships.  He continues to perseverate that he and his ex-girlfriend had such a great relationship.  I challenged him in that statement, expressing it would seem unusual for such a great relationship to end so abruptly, and how can he consider some of his emotional intelligence to understand what could have gone wrong in the relationship.  I spent time with the patient educating him on IQ and EQ, and he continues to have significant difficulties in understanding emotional experiences of others and himself.  He continues to struggle with self-esteem, reports that he admires himself, but he does not like himself.  He asserts that he can learn pretty much anything he sets his mind to, and I once again challenged that statement for him to consider whether or not certain things were worth learning.  For example, and he suggests that he is able to learn the entire neurophysiology of psychiatric  care text book that he looked up on [employer], if he just sits down and reads through it.  He continues to display the sort of grandiose ideas about himself, and I suggested that perhaps easier for him to think that he is able to do that, rather than to face the reality of uncertainty.  He continues to work with a new therapist locally.  He is not sure about this individual and expresses some apprehensions.  I suggested he work with a therapist in our network, and made a recommendation to him.  Visit Diagnosis:    ICD-10-CM   1. Mixed obsessional thoughts and acts F42.2 Ambulatory referral to Psychology  2. 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 Persistent depressive disorder F34.1 clomiPRAMINE (ANAFRANIL) 75 MG capsule    Ambulatory referral to Psychology    Past Psychiatric History: See intake H&P for full details. Reviewed, with no updates at this time.   Past Medical History:  Past Medical History:  Diagnosis Date  . Anxiety   . Eczema   . HSV-1 infection     Past Surgical History:  Procedure Laterality Date  . TONSILLECTOMY      Family Psychiatric History: See intake H&P for full details. Reviewed, with no updates at this time.   Family History:  Family History  Problem Relation Age of Onset  . Alcohol abuse Father   . Physical abuse Father   . High blood pressure Father   .  Physical abuse Sister   . Alcohol abuse Brother   . Drug abuse Brother   . Physical abuse Brother   . High blood pressure Brother   . Gout Brother   . OCD Paternal Uncle   . High blood pressure Mother     Social History:  Social History   Socioeconomic History  . Marital status: Single    Spouse name: Not on file  . Number of children: Not on file  . Years of education: Not on file  . Highest education level: Not on file  Social Needs  . Financial resource strain: Patient  refused  . Food insecurity - worry: Patient refused  . Food insecurity - inability: Patient refused  . Transportation needs - medical: Patient refused  . Transportation needs - non-medical: Patient refused  Occupational History  . Not on file  Tobacco Use  . Smoking status: Never Smoker  . Smokeless tobacco: Never Used  Substance and Sexual Activity  . Alcohol use: No    Comment: Occasioal use  . Drug use: No  . Sexual activity: Yes    Birth control/protection: Condom, Pill  Other Topics Concern  . Not on file  Social History Narrative  . Not on file    Allergies:  Allergies  Allergen Reactions  . Amoxicillin     Had childhood reaction    Metabolic Disorder Labs: No results found for: HGBA1C, MPG No results found for: PROLACTIN No results found for: CHOL, TRIG, HDL, CHOLHDL, VLDL, LDLCALC No results found for: TSH  Therapeutic Level Labs: No results found for: LITHIUM No results found for: VALPROATE No components found for:  CBMZ  Current Medications: Current Outpatient Medications  Medication Sig Dispense Refill  . clomiPRAMINE (ANAFRANIL) 75 MG capsule Take 2 capsules (150 mg total) by mouth at bedtime. 60 capsule 2  . clonazePAM (KLONOPIN) 0.5 MG tablet Take 1 tablet (0.5 mg total) by mouth 2 (two) times daily as needed for anxiety. Take 1 tablet twice a day 90 tablet 1  . hydrocortisone cream 1 % Apply to affected area 2 times daily 15 g 0  . valACYclovir (VALTREX) 500 MG tablet Take 4 tablets (2,000 mg total) by mouth 2 (two) times daily. 20 tablet 0   No current facility-administered medications for this visit.      Musculoskeletal: Strength & Muscle Tone: within normal limits Gait & Station: normal Patient leans: N/A  Psychiatric Specialty Exam: ROS  There were no vitals taken for this visit.There is no height or weight on file to calculate BMI.  General Appearance: Casual and Fairly Groomed  Eye Contact:  Fair  Speech:  Clear and Coherent and  Normal Rate  Volume:  Normal  Mood:  Anxious and Euthymic  Affect:  Appropriate and Congruent  Thought Process:  Goal Directed and Descriptions of Associations: Circumstantial  Orientation:  Full (Time, Place, and Person)  Thought Content: Rumination   Suicidal Thoughts:  No  Homicidal Thoughts:  No  Memory:  Immediate;   Good  Judgement:  Fair  Insight:  Shallow  Psychomotor Activity:  Normal  Concentration:  Concentration: Fair  Recall:  Fair  Fund of Knowledge: Fair  Language: Good  Akathisia:  Negative  Handed:  Left  AIMS (if indicated): not done  Assets:  Communication Skills Desire for Improvement Financial Resources/Insurance Housing Vocational/Educational  ADL's:  Intact  Cognition: WNL  Sleep:  Good   Screenings: AIMS     [facility]  AIMS Total Score  0    AUDIT     [facility]  Alcohol Use Disorder Identification Test Final Score (AUDIT)  0    GAD-7     Counselor from 11/11/2016 in [facility] Counselor from 10/27/2016 in [facility]  Total GAD-7 Score  6  11    PHQ2-9     Counselor from 11/11/2016 in [facility] Counselor from 10/27/2016 in [facility]  PHQ-2 Total Score  4  3  PHQ-9 Total Score  14  9      Assessment and Plan:  Tyquarius Paglia is a 22 year old male with a complex presentation consistent with OCD, symptoms of persistent depressive disorder, complicated by characterologic and personality disordered features.  But he does feel that the clomipramine is helping his obsessive thinking and depression, and reports that he has periods of depression and anxiety that breakthrough, particularly related to interpersonal interactions and romantic  interactions.  He presents with a fairly rigid way of thinking about himself and the world, and presents symptoms of OCPD as well.  He has significant difficulties in interpersonal romantic relationships, and tends to feel that others should also fall in line with a set of rules in terms of interpersonal interactions.  He then tends to obsess about the relationships once they have gone wrong, or they have ended, and in this way display significant obsessive thoughts and difficulty untethering himself from ruminative thinking.  He has a complex background and describes a family of very obsessive and controlling individuals.  I spent time with him today encouraging him to take some time to focus on himself and developing his EQ, and consider taking a break from dating, as this does appear to tend to contribute to worsening periods of rumination and obsession.  I provided him with a referral for individual therapy, I am Steve Hodge he can take away some skills and being able to introspect into his emotional experience, and in turn be able to better understand the emotional expense of others.   1. Mixed obsessional thoughts and acts   2. Severe recurrent major depression without psychotic features (HCC)   3. Persistent depressive disorder     Status of current problems: gradually improving  Labs Ordered: Orders Placed This Encounter  Procedures  . Ambulatory referral to Psychology    Referral Priority:   Routine    Referral Type:   Psychiatric    Referral Reason:   Specialty Services Required    Requested Specialty:   Psychology    Number of Visits Requested:   1    Labs Reviewed: n/a  Collateral Obtained/Records Reviewed: n/a  Plan:  Clomipramine 150 mg daily Clonazepam 0.5-1 mg daily Return to clinic in 10 weeks Referral for individual therapy  I spent 30 minutes with the patient in direct face-to-face clinical care.  Greater than 50% of this time was spent in counseling and coordination of  care with the patient.    Burnard Leigh, MD 07/17/2017, 11:49 AM

## 2017-08-03 ENCOUNTER — Ambulatory Visit: Payer: BC Managed Care – PPO | Admitting: Clinical

## 2017-09-01 ENCOUNTER — Telehealth: Payer: Self-pay | Admitting: Physician Assistant

## 2017-09-01 NOTE — Telephone Encounter (Signed)
Call from Elisabeth PigeonLauren Berry at [facility]  She thinks he has some personality problems.  Medications will likely not fix all his problems.  Narcicisstic and unaccountable - he does not feel like he can do anything to help himself - other people all causing him to not succeed.  He has depression.  He has told her that she is not helpful.  His psychiatrist has gotten him on anafranil and the patient feels like it is helping.  ? Helpful to increase anafranil.  We will do a level.   Low energy low motivation depression.

## 2017-09-12 ENCOUNTER — Other Ambulatory Visit: Payer: Self-pay

## 2017-09-12 ENCOUNTER — Encounter: Payer: Self-pay | Admitting: Physician Assistant

## 2017-09-12 ENCOUNTER — Ambulatory Visit: Payer: BC Managed Care – PPO | Admitting: Physician Assistant

## 2017-09-12 ENCOUNTER — Telehealth: Payer: Self-pay | Admitting: Physician Assistant

## 2017-09-12 VITALS — BP 110/60 | HR 98 | Temp 98.1°F | Resp 18 | Ht 73.23 in | Wt 154.8 lb

## 2017-09-12 DIAGNOSIS — F341 Dysthymic disorder: Secondary | ICD-10-CM | POA: Diagnosis not present

## 2017-09-12 DIAGNOSIS — F411 Generalized anxiety disorder: Secondary | ICD-10-CM | POA: Diagnosis not present

## 2017-09-12 DIAGNOSIS — B002 Herpesviral gingivostomatitis and pharyngotonsillitis: Secondary | ICD-10-CM

## 2017-09-12 MED ORDER — VALACYCLOVIR HCL 1 G PO TABS: 2000.0000 mg | ORAL_TABLET | Freq: Two times a day (BID) | ORAL | 0 refills | Status: DC
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
## 2017-09-12 NOTE — Telephone Encounter (Signed)
[facility] - he is having side effects from his anafranil - they are changing the dose to at night to see if that helps - we will also draw a level to make sure he is in metabolite range - if the side effects so not improve consider Pristiq to help with his anxiety.

## 2017-09-12 NOTE — Patient Instructions (Addendum)
Please download the APP called mychart - then use the text to activate this APP - this will allow you to look at your labs and contact me as well as make appointments to see me in the future.     IF you received an x-ray today, you will receive an invoice from [facility]. Please contact [facility] at [phone] with questions or concerns regarding your invoice.   IF you received labwork today, you will receive an invoice from [facility]. Please contact [facility] at [phone] with questions or concerns regarding your invoice.   Our billing staff will not be able to assist you with questions regarding bills from these companies.  You will be contacted with the lab results as soon as they are available. The fastest way to get your results is to activate your My Chart account. Instructions are located on the last page of this paperwork. If you have not heard from us regarding the results in 2 weeks, please contact this office.     

## 2017-09-13 LAB — CLOMIPRAMINE AND METABOLITE, SERUM
Clomipramine Lvl: 77 ng/mL (ref 70–200)
NORCLOMIPRAMINE, S: 97 ng/mL — AB (ref 150–300)
Total (Clo+Norclo): 174 ng/mL — ABNORMAL LOW (ref 220–500)

## 2017-09-25 ENCOUNTER — Ambulatory Visit (HOSPITAL_COMMUNITY): Payer: BC Managed Care – PPO | Admitting: Psychiatry

## 2017-10-16 ENCOUNTER — Telehealth: Payer: Self-pay | Admitting: Physician Assistant

## 2017-10-16 DIAGNOSIS — F341 Dysthymic disorder: Secondary | ICD-10-CM

## 2017-10-16 DIAGNOSIS — F411 Generalized anxiety disorder: Secondary | ICD-10-CM

## 2017-10-16 MED ORDER — DESVENLAFAXINE SUCCINATE ER 25 MG PO TB24: 25.0000 mg | ORAL_TABLET | Freq: Every day | ORAL | 0 refills | Status: DC

## 2017-10-16 MED ORDER — CLOMIPRAMINE HCL 50 MG PO CAPS: 100.0000 mg | ORAL_CAPSULE | Freq: Every day | ORAL | 0 refills | Status: DC

## 2017-10-16 NOTE — Telephone Encounter (Signed)
Got a call from Lauren at [facility]. He has decreased to Anafranil .  The decrease in medication has helped his side effects - he has had some increase in depression with the decrease in the dose as well as his anxiety has never been treated with this.  We will add pristiq now in hopes to control his anxiety.  He has an appt with me at the end of this month.

## 2017-10-24 ENCOUNTER — Ambulatory Visit: Payer: BC Managed Care – PPO | Admitting: Physician Assistant

## 2017-11-07 ENCOUNTER — Telehealth: Payer: Self-pay | Admitting: Physician Assistant

## 2017-11-07 DIAGNOSIS — F341 Dysthymic disorder: Secondary | ICD-10-CM

## 2017-11-07 DIAGNOSIS — F411 Generalized anxiety disorder: Secondary | ICD-10-CM

## 2017-11-07 MED ORDER — DESVENLAFAXINE SUCCINATE ER 50 MG PO TB24: 50.0000 mg | ORAL_TABLET | Freq: Every day | ORAL | 0 refills | Status: DC
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
## 2017-11-24 ENCOUNTER — Ambulatory Visit: Payer: BC Managed Care – PPO | Admitting: Physician Assistant

## 2017-11-24 ENCOUNTER — Encounter: Payer: Self-pay | Admitting: Physician Assistant

## 2017-11-24 ENCOUNTER — Other Ambulatory Visit: Payer: Self-pay

## 2017-11-24 DIAGNOSIS — F332 Major depressive disorder, recurrent severe without psychotic features: Secondary | ICD-10-CM

## 2017-11-24 DIAGNOSIS — F341 Dysthymic disorder: Secondary | ICD-10-CM

## 2017-11-24 DIAGNOSIS — F411 Generalized anxiety disorder: Secondary | ICD-10-CM

## 2017-11-24 MED ORDER — CLOMIPRAMINE HCL 50 MG PO CAPS: 100.0000 mg | ORAL_CAPSULE | Freq: Every day | ORAL | 0 refills | Status: DC

## 2017-11-24 MED ORDER — DESVENLAFAXINE SUCCINATE ER 100 MG PO TB24
100.0000 mg | ORAL_TABLET | Freq: Every day | ORAL | 1 refills | Status: DC
Start: 2017-11-24 — End: 2018-02-03

## 2017-11-24 NOTE — Patient Instructions (Signed)
     IF you received an x-ray today, you will receive an invoice from [facility]. Please contact [facility] at [phone] with questions or concerns regarding your invoice.   IF you received labwork today, you will receive an invoice from [facility]. Please contact [facility] at [phone] with questions or concerns regarding your invoice.   Our billing staff will not be able to assist you with questions regarding bills from these companies.  You will be contacted with the lab results as soon as they are available. The fastest way to get your results is to activate your My Chart account. Instructions are located on the last page of this paperwork. If you have not heard from us regarding the results in 2 weeks, please contact this office.     
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
## 2017-11-27 ENCOUNTER — Encounter: Payer: Self-pay | Admitting: Physician Assistant

## 2017-12-27 ENCOUNTER — Emergency Department (HOSPITAL_COMMUNITY)
Admission: EM | Admit: 2017-12-27 | Discharge: 2017-12-27 | Disposition: A | Discharge status: Home / Self Care | Payer: Worker's Compensation | Attending: Emergency Medicine | Admitting: Emergency Medicine

## 2017-12-27 ENCOUNTER — Emergency Department (HOSPITAL_COMMUNITY): Payer: Worker's Compensation

## 2017-12-27 ENCOUNTER — Encounter (HOSPITAL_COMMUNITY): Payer: Self-pay

## 2017-12-27 DIAGNOSIS — S0990XA Unspecified injury of head, initial encounter: Secondary | ICD-10-CM | POA: Diagnosis present

## 2017-12-27 DIAGNOSIS — S0101XA Laceration without foreign body of scalp, initial encounter: Secondary | ICD-10-CM | POA: Insufficient documentation

## 2017-12-27 DIAGNOSIS — Y929 Unspecified place or not applicable: Secondary | ICD-10-CM | POA: Diagnosis not present

## 2017-12-27 DIAGNOSIS — Y9389 Activity, other specified: Secondary | ICD-10-CM | POA: Insufficient documentation

## 2017-12-27 DIAGNOSIS — Z23 Encounter for immunization: Secondary | ICD-10-CM | POA: Diagnosis not present

## 2017-12-27 DIAGNOSIS — Y990 Civilian activity done for income or pay: Secondary | ICD-10-CM | POA: Diagnosis not present

## 2017-12-27 MED ORDER — LIDOCAINE-EPINEPHRINE-TETRACAINE (LET) SOLUTION
3.0000 mL | Freq: Once | NASAL | Status: AC
  Administered 2017-12-27: 3 mL via TOPICAL
  Filled 2017-12-27: qty 3

## 2017-12-27 MED ORDER — TETANUS-DIPHTH-ACELL PERTUSSIS 5-2.5-18.5 LF-MCG/0.5 IM SUSP
0.5000 mL | Freq: Once | INTRAMUSCULAR | Status: AC
  Administered 2017-12-27: 0.5 mL via INTRAMUSCULAR
  Filled 2017-12-27: qty 0.5

## 2017-12-27 NOTE — ED Provider Notes (Signed)
ED ECG REPORT   Date: 12/27/2017 0103  Rate: 108  Rhythm: sinus tachycardia  QRS Axis: normal  Intervals: normal  ST/T Wave abnormalities: nonspecific T wave changes  Conduction Disutrbances:nonspecific intraventricular conduction delay  Narrative Interpretation:   Old EKG Reviewed: none available  I have personally reviewed the EKG tracing and agree with the computerized printout as noted.    Zadie RhineWickline, Margarita Bobrowski, MD 12/27/17 [phone]

## 2017-12-27 NOTE — ED Notes (Signed)
Patient verbalizes understanding of medications and discharge instructions. No further questions at this time. VSS and patient ambulatory at discharge.   
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
## 2017-12-27 NOTE — Discharge Instructions (Signed)
Can take tylenol or motrin for pain/headache. Follow-up with your primary care doctor in 1 week for staple removal. Return to the ED for new or worsening symptoms.

## 2018-01-03 ENCOUNTER — Other Ambulatory Visit: Payer: Self-pay

## 2018-01-03 ENCOUNTER — Encounter: Payer: Self-pay | Admitting: Physician Assistant

## 2018-01-03 ENCOUNTER — Ambulatory Visit: Payer: BC Managed Care – PPO | Admitting: Physician Assistant

## 2018-01-03 VITALS — BP 110/78 | HR 60 | Temp 98.0°F | Resp 18 | Ht 74.0 in | Wt 159.4 lb

## 2018-01-03 DIAGNOSIS — S0101XA Laceration without foreign body of scalp, initial encounter: Secondary | ICD-10-CM | POA: Diagnosis not present

## 2018-01-03 NOTE — Patient Instructions (Signed)
     IF you received an x-ray today, you will receive an invoice from [facility]. Please contact [facility] at [phone] with questions or concerns regarding your invoice.   IF you received labwork today, you will receive an invoice from [facility]. Please contact [facility] at [phone] with questions or concerns regarding your invoice.   Our billing staff will not be able to assist you with questions regarding bills from these companies.  You will be contacted with the lab results as soon as they are available. The fastest way to get your results is to activate your My Chart account. Instructions are located on the last page of this paperwork. If you have not heard from us regarding the results in 2 weeks, please contact this office.     
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
## 2018-02-03 ENCOUNTER — Ambulatory Visit (INDEPENDENT_AMBULATORY_CARE_PROVIDER_SITE_OTHER): Payer: BC Managed Care – PPO | Admitting: Psychiatry

## 2018-02-03 ENCOUNTER — Encounter (HOSPITAL_COMMUNITY): Payer: Self-pay | Admitting: Psychiatry

## 2018-02-03 VITALS — BP 120/74 | HR 76 | Ht 74.0 in | Wt 165.0 lb

## 2018-02-03 DIAGNOSIS — F341 Dysthymic disorder: Secondary | ICD-10-CM | POA: Diagnosis not present

## 2018-02-03 DIAGNOSIS — Z811 Family history of alcohol abuse and dependence: Secondary | ICD-10-CM

## 2018-02-03 DIAGNOSIS — F419 Anxiety disorder, unspecified: Secondary | ICD-10-CM

## 2018-02-03 DIAGNOSIS — F428 Other obsessive-compulsive disorder: Secondary | ICD-10-CM

## 2018-02-03 DIAGNOSIS — F332 Major depressive disorder, recurrent severe without psychotic features: Secondary | ICD-10-CM

## 2018-02-03 DIAGNOSIS — F422 Mixed obsessional thoughts and acts: Secondary | ICD-10-CM

## 2018-02-03 MED ORDER — CLOMIPRAMINE HCL 50 MG PO CAPS: 100.0000 mg | ORAL_CAPSULE | Freq: Every day | ORAL | 0 refills | Status: AC

## 2018-02-03 MED ORDER — CLONAZEPAM 0.5 MG PO TABS: 0.5000 mg | ORAL_TABLET | Freq: Every day | ORAL | 1 refills | Status: AC | PRN
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
## 2018-04-30 ENCOUNTER — Ambulatory Visit (HOSPITAL_COMMUNITY): Payer: BC Managed Care – PPO | Admitting: Psychiatry
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
## 2021-06-29 DIAGNOSIS — F422 Mixed obsessional thoughts and acts: Secondary | ICD-10-CM | POA: Diagnosis not present

## 2021-06-29 DIAGNOSIS — F411 Generalized anxiety disorder: Secondary | ICD-10-CM | POA: Diagnosis not present

## 2021-06-29 DIAGNOSIS — F900 Attention-deficit hyperactivity disorder, predominantly inattentive type: Secondary | ICD-10-CM | POA: Diagnosis not present

## 2021-06-29 DIAGNOSIS — R69 Illness, unspecified: Secondary | ICD-10-CM | POA: Diagnosis not present

## 2021-08-03 DIAGNOSIS — F900 Attention-deficit hyperactivity disorder, predominantly inattentive type: Secondary | ICD-10-CM | POA: Diagnosis not present

## 2021-08-03 DIAGNOSIS — F3341 Major depressive disorder, recurrent, in partial remission: Secondary | ICD-10-CM | POA: Diagnosis not present

## 2021-08-03 DIAGNOSIS — R69 Illness, unspecified: Secondary | ICD-10-CM | POA: Diagnosis not present

## 2021-08-03 DIAGNOSIS — F411 Generalized anxiety disorder: Secondary | ICD-10-CM | POA: Diagnosis not present

## 2021-09-07 ENCOUNTER — Other Ambulatory Visit: Payer: Self-pay

## 2021-09-07 ENCOUNTER — Ambulatory Visit (HOSPITAL_COMMUNITY)
Admission: EM | Admit: 2021-09-07 | Discharge: 2021-09-07 | Disposition: A | Discharge status: Home / Self Care | Payer: 59

## 2021-09-07 ENCOUNTER — Encounter (HOSPITAL_COMMUNITY): Payer: Self-pay | Admitting: Emergency Medicine

## 2021-09-07 DIAGNOSIS — L2082 Flexural eczema: Secondary | ICD-10-CM

## 2021-09-07 DIAGNOSIS — B009 Herpesviral infection, unspecified: Secondary | ICD-10-CM

## 2021-09-07 DIAGNOSIS — R238 Other skin changes: Secondary | ICD-10-CM

## 2021-09-07 DIAGNOSIS — B002 Herpesviral gingivostomatitis and pharyngotonsillitis: Secondary | ICD-10-CM

## 2021-09-07 MED ORDER — TRIAMCINOLONE ACETONIDE 0.1 % EX CREA: 1.0000 "application " | TOPICAL_CREAM | Freq: Two times a day (BID) | CUTANEOUS | 1 refills | Status: AC

## 2021-09-07 MED ORDER — HYDROCORTISONE 1 % EX CREA: TOPICAL_CREAM | CUTANEOUS | 0 refills | Status: DC

## 2021-09-07 MED ORDER — VALACYCLOVIR HCL 500 MG PO TABS: 500.0000 mg | ORAL_TABLET | Freq: Two times a day (BID) | ORAL | 1 refills | Status: DC

## 2021-09-07 NOTE — ED Provider Notes (Signed)
?[facility] ? ? ? ?CSN: [id number] ?Arrival date & time: 09/07/21  1216 ? ? ?  ? ?History   ?Chief Complaint ?No chief complaint on file. ? ? ?HPI ?Steve Hodge is a 26 y.o. male.  ? ?Patient presents today with several concerns.  He has a prolonged history of eczema that has worsened in the past several weeks.  He has been using Vaseline without improvement of symptoms.  Denies any changes to personal hygiene products including soaps, detergents, medications.  He is not currently followed by dermatology and has not tried any modulating medication for this condition in the past.  He typically treats with topical steroids but is out of this prescription requesting a refill today.  He reports significant rash and pruritus involving his hands, antecubital space, neck. ? ?In addition, patient has a history of recurrent HSV 1 infection.  He was previously prescribed Valtrex to be used as abortive therapy but has been without this medication.  He reports that lesions often involve perioral region as well as on his face and neck.  He has developed a similar episode involving his right forehead and is concerned as symptoms are becoming more frequent and have cross midline.  He denies any recurrent oral lesions or ulcers.  He is eating and drinking normally.  Denies history of immunosuppression. ? ? ?Past Medical History:  ?Diagnosis Date  ? Allergy   ? Anxiety   ? Depression   ? Eczema   ? HSV-1 infection   ? ? ?Patient Active Problem List  ? Diagnosis Date Noted  ? Persistent depressive disorder 03/02/2017  ? Systolic murmur 02/01/2017  ? Atopic dermatitis 11/24/2010  ? ? ?Past Surgical History:  ?Procedure Laterality Date  ? TONSILLECTOMY    ? ? ? ? ? ?Home Medications   ? ?Prior to Admission medications   ?Medication Sig Start Date End Date Taking? Authorizing Provider  ?buPROPion (WELLBUTRIN XL) 300 MG 24 hr tablet Take by mouth. 08/03/21  Yes [provider]  ?hydrocortisone cream 1 % Apply to  affected area 2 times daily on face but avoid around eyes 09/07/21  Yes Quinnton Bury K, PA-C  ?triamcinolone cream (KENALOG) 0.1 % Apply 1 application. topically 2 (two) times daily. Apply on extremities and trunk; avoid on face. 09/07/21  Yes Ferdinand Revoir, Noberto RetortErin K, PA-C  ?ARIPiprazole (ABILIFY) 2 MG tablet Take 4 mg by mouth at bedtime. 08/04/21   [provider]  ?clomiPRAMINE (ANAFRANIL) 50 MG capsule Take 2 capsules (100 mg total) by mouth at bedtime. 02/03/18   Thresa RossAkhtar, Nadeem, MD  ?clonazePAM (KLONOPIN) 0.5 MG tablet Take 1 tablet (0.5 mg total) by mouth daily as needed for anxiety. Take one a day only if needed 02/03/18   Thresa RossAkhtar, Nadeem, MD  ?sertraline (ZOLOFT) 100 MG tablet Take 200 mg by mouth daily. 08/09/21   [provider]  ?valACYclovir (VALTREX) 500 MG tablet Take 1 tablet (500 mg total) by mouth 2 (two) times daily. 09/07/21   Foxx Klarich, Noberto RetortErin K, PA-C  ?desvenlafaxine (PRISTIQ) 100 MG 24 hr tablet Take 1 tablet (100 mg total) by mouth daily. ?Patient not taking: Reported on 02/03/2018 11/24/17 02/03/18  Morrell RiddleWeber, Sarah L, PA-C  ? ? ?Family History ?Family History  ?Problem Relation Age of Onset  ? Alcohol abuse Father   ? Physical abuse Father   ? High blood pressure Father   ? Physical abuse Sister   ? Alcohol abuse Brother   ? Physical abuse Brother   ? High blood  pressure Brother   ? Gout Brother   ? OCD Paternal Uncle   ? High blood pressure Mother   ? ? ?Social History ?Social History  ? ?Tobacco Use  ? Smoking status: Never  ? Smokeless tobacco: Never  ?Vaping Use  ? Vaping Use: Never used  ?Substance Use Topics  ? Alcohol use: No  ?  Comment: social  ? Drug use: No  ? ? ? ?Allergies   ?Amoxicillin ? ? ?Review of Systems ?Review of Systems  ?Constitutional:  Positive for activity change. Negative for appetite change, fatigue and fever.  ?HENT:  Negative for mouth sores, sore throat and trouble swallowing.   ?Skin:  Positive for rash.  ? ? ?Physical Exam ?Triage Vital Signs ?ED Triage Vitals  ?Enc Vitals  Group  ?   BP 09/07/21 1353 126/88  ?   Pulse Rate 09/07/21 1353 76  ?   Resp 09/07/21 1353 18  ?   Temp 09/07/21 1353 97.7 ?F (36.5 ?C)  ?   Temp Source 09/07/21 1353 Oral  ?   SpO2 09/07/21 1353 100 %  ?   Weight --   ?   Height --   ?   Head Circumference --   ?   Peak Flow --   ?   Pain Score 09/07/21 1349 0  ?   Pain Loc --   ?   Pain Edu? --   ?   Excl. in GC? --   ? ?No data found. ? ?Updated Vital Signs ?BP 126/88 (BP Location: Left Arm)   Pulse 76   Temp 97.7 ?F (36.5 ?C) (Oral)   Resp 18   SpO2 100%  ? ?Visual Acuity ?Right Eye Distance:   ?Left Eye Distance:   ?Bilateral Distance:   ? ?Right Eye Near:   ?Left Eye Near:    ?Bilateral Near:    ? ?Physical Exam ?Vitals reviewed.  ?Constitutional:   ?   General: He is awake.  ?   Appearance: Normal appearance. He is well-developed. He is not ill-appearing.  ?   Comments: Very pleasant male appears stated age in no acute distress sitting comfortably in exam room  ?HENT:  ?   Head: Normocephalic and atraumatic.  ?   Mouth/Throat:  ?   Mouth: No oral lesions.  ?   Pharynx: Uvula midline. No oropharyngeal exudate, posterior oropharyngeal erythema or uvula swelling.  ?Cardiovascular:  ?   Rate and Rhythm: Normal rate and regular rhythm.  ?   Heart sounds: Normal heart sounds, S1 normal and S2 normal. No murmur heard. ?Pulmonary:  ?   Effort: Pulmonary effort is normal.  ?   Breath sounds: Normal breath sounds. No stridor. No wheezing, rhonchi or rales.  ?   Comments: Clear to auscultation bilaterally ?Skin: ?   Comments: Maculopapular rash with scaling noted hands and antecubital region.  ?Neurological:  ?   Mental Status: He is alert.  ?Psychiatric:     ?   Behavior: Behavior is cooperative.  ? ? ? ?UC Treatments / Results  ?Labs ?(all labs ordered are listed, but only abnormal results are displayed) ?Labs Reviewed - No data to display ? ?EKG ? ? ?Radiology ?No results found. ? ?Procedures ?Procedures (including critical care time) ? ?Medications Ordered in  UC ?Medications - No data to display ? ?Initial Impression / Assessment and Plan / UC Course  ?I have reviewed the triage vital signs and the nursing notes. ? ?Pertinent labs & imaging results that  were available during my care of the patient were reviewed by me and considered in my medical decision making (see chart for details). ? ?  ? ?Symptoms consistent with uncontrolled eczema.  Patient was encouraged to continue using emollients and recommended using Aquaphor rather than Vaseline.  He is to stop electronic soaps and detergents.  He was prescribed Kenalog 0.1% to be used on extremities up to twice a day as needed.  He was prescribed hydrocortisone cream 1% to be used on his face but discouraged use to surrounding his eyes.  Discussed that there are new treatments available that modulate the immune system and decrease need for steroid use but he would need to follow-up with a dermatologist.  He was given contact information for dermatologist encouraged to call to schedule an appointment.  He does not currently have a primary care provider so we will try to establish him with 1 via PCP assistance.  Discussed that if he has spread of rash or any worsening symptoms he is to return for reevaluation. ? ?Given recurrent and worsening symptoms of HSV will start suppressive therapy.  He was prescribed valacyclovir 500 mg twice daily.  Discussed that he should follow-up with dermatology for further evaluation and management.  If he develops any widespread rash including involvement of eye he needs to be seen immediately.  Strict return precautions given to which he expressed understanding. ? ?Final Clinical Impressions(s) / UC Diagnoses  ? ?Final diagnoses:  ?Flexural eczema  ?Skin irritation  ?HSV-1 infection  ? ? ? ?Discharge Instructions   ? ?  ?Use Aquaphor as we discussed.  Use Kenalog 0.1% on your arms and hands up to twice a day.  Use hydrocortisone on your face.  Avoid using this around your eyes.  I do think  you should follow-up with a dermatologist.  Please call to schedule an appointment.  Start Valtrex twice daily.  If you have any widespread rash, involvement of your eye, involvement of your mouth sure you are hav

## 2021-09-07 NOTE — Discharge Instructions (Signed)
Use Aquaphor as we discussed.  Use Kenalog 0.1% on your arms and hands up to twice a day.  Use hydrocortisone on your face.  Avoid using this around your eyes.  I do think you should follow-up with a dermatologist.  Please call to schedule an appointment.  Start Valtrex twice daily.  If you have any widespread rash, involvement of your eye, involvement of your mouth sure you are having difficulty eating, high fever you need to be seen immediately. ?

## 2021-09-07 NOTE — ED Triage Notes (Signed)
Eczema exacerbation. Requesting  medication for this flare up.   Patient reports HSV-1 virus .  Patient requesting valtrex.   ?

## 2021-12-14 DIAGNOSIS — F3341 Major depressive disorder, recurrent, in partial remission: Secondary | ICD-10-CM | POA: Diagnosis not present

## 2021-12-14 DIAGNOSIS — F900 Attention-deficit hyperactivity disorder, predominantly inattentive type: Secondary | ICD-10-CM | POA: Diagnosis not present

## 2021-12-14 DIAGNOSIS — F422 Mixed obsessional thoughts and acts: Secondary | ICD-10-CM | POA: Diagnosis not present

## 2021-12-14 DIAGNOSIS — R69 Illness, unspecified: Secondary | ICD-10-CM | POA: Diagnosis not present

## 2022-04-19 DIAGNOSIS — F422 Mixed obsessional thoughts and acts: Secondary | ICD-10-CM | POA: Diagnosis not present

## 2022-04-19 DIAGNOSIS — R69 Illness, unspecified: Secondary | ICD-10-CM | POA: Diagnosis not present

## 2022-05-12 ENCOUNTER — Encounter (HOSPITAL_COMMUNITY): Payer: Self-pay

## 2022-05-12 ENCOUNTER — Ambulatory Visit (HOSPITAL_COMMUNITY)
Admission: EM | Admit: 2022-05-12 | Discharge: 2022-05-12 | Disposition: A | Discharge status: Home / Self Care | Payer: BC Managed Care – PPO | Attending: Family Medicine | Admitting: Family Medicine

## 2022-05-12 DIAGNOSIS — B002 Herpesviral gingivostomatitis and pharyngotonsillitis: Secondary | ICD-10-CM | POA: Diagnosis not present

## 2022-05-12 DIAGNOSIS — L301 Dyshidrosis [pompholyx]: Secondary | ICD-10-CM | POA: Diagnosis not present

## 2022-05-12 DIAGNOSIS — B009 Herpesviral infection, unspecified: Secondary | ICD-10-CM

## 2022-05-12 MED ORDER — VALACYCLOVIR HCL 1 G PO TABS: ORAL_TABLET | ORAL | 1 refills | Status: AC

## 2022-05-12 MED ORDER — FLUOCINONIDE 0.05 % EX OINT: 1.0000 | TOPICAL_OINTMENT | Freq: Two times a day (BID) | CUTANEOUS | 0 refills | Status: AC

## 2022-05-12 NOTE — ED Triage Notes (Signed)
Pt reports an history of HSV-1 and would like a refill on his valtrex.

## 2022-05-17 NOTE — ED Provider Notes (Signed)
[facility]   [id number] 05/12/22 Arrival Time: 1750  ASSESSMENT & PLAN:  1. Herpes simplex   2. Oral herpes   3. Dyshidrotic eczema   Exacerbation of both. No signs of bacterial skin infection.  Meds ordered this encounter  Medications   valACYclovir (VALTREX) 1000 MG tablet    Sig: Take as directed with each outbreak.    Dispense:  30 tablet    Refill:  1   fluocinonide ointment (LIDEX) 0.05 %    Sig: Apply 1 Application topically 2 (two) times daily. For up to one week.    Dispense:  60 g    Refill:  0   May f/u here as needed.  Will follow up with PCP or here if worsening or failing to improve as anticipated. Reviewed expectations re: course of current medical issues. Questions answered. Outlined signs and symptoms indicating need for more acute intervention. Patient verbalized understanding. After Visit Summary given.   SUBJECTIVE:  Steve Hodge is a 26 y.o. male who presents with a skin complaint. H/O HSV 1 with current outbreak/exacerbation; x 2 days. Out of [place]. Tolerating PO intake without difficulty. Also with skin irritation/cracking/blistering of bilateral hands; h/o similar in past. Is painful at times. No tx PTA.    OBJECTIVE: Vitals:   05/12/22 1910 05/12/22 1911 05/12/22 1913  BP:  130/73   Pulse: 78 90   Resp: 16    Temp: (!) 97.5 F (36.4 C)  97.8 F (36.6 C)  TempSrc: Oral  Oral  SpO2: 98%      General appearance: alert; no distress HEENT: [place]; AT Neck: supple with FROM Extremities: no edema; moves all extremities normally Skin: warm and dry; vesicular outbreak of L lower lip and chin consistent with HSV; both hands with dried/cracked blistering skin consistent with dyshidrotic eczema/; no signs of superficial bacterial infection Psychological: alert and cooperative; normal mood and affect  Allergies  Allergen Reactions   Amoxicillin     Had childhood reaction    Past Medical History:  Diagnosis Date   Allergy     Anxiety    Depression    Eczema    HSV-1 infection    Social History   Socioeconomic History   Marital status: Single    Spouse name: Not on file   Number of children: Not on file   Years of education: Not on file   Highest education level: Not on file  Occupational History   Not on file  Tobacco Use   Smoking status: Never   Smokeless tobacco: Never  Vaping Use   Vaping Use: Never used  Substance and Sexual Activity   Alcohol use: No    Comment: social   Drug use: No   Sexual activity: Yes    Birth control/protection: Condom  Other Topics Concern   Not on file  Social History Narrative   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      Lives alone   Family in [place]   Social Determinants of Health   Financial Resource Strain: Unknown (04/19/2017)   Overall Financial Resource Strain (CARDIA)    Difficulty of Paying Living Expenses: Patient refused  Food Insecurity: Unknown (04/19/2017)   Hunger Vital Sign    Worried About Running Out of Food in the Last Year: Patient refused    Ran Out of Food in the Last Year: Patient refused  Transportation Needs: Unknown (04/19/2017)   PRAPARE - Administrator, Civil Service (Medical): Patient refused  Lack of Transportation (Non-Medical): Patient refused  Physical Activity: Unknown (04/19/2017)   Exercise Vital Sign    Days of Exercise per Week: Patient refused    Minutes of Exercise per Session: Patient refused  Stress: Unknown (04/19/2017)   Elim    Feeling of Stress : Patient refused  Social Connections: Unknown (04/19/2017)   Social Connection and Isolation Panel [NHANES]    Frequency of Communication with Friends and Family: Patient refused    Frequency of Social Gatherings with Friends and Family: Patient refused    Attends Religious Services: Patient refused    Active Member of Clubs or Organizations: Patient refused    Attends English as a second language teacher Meetings: Patient refused    Marital Status: Patient refused  Intimate Partner Violence: Unknown (04/19/2017)   Humiliation, Afraid, Rape, and Kick questionnaire    Fear of Current or Ex-Partner: Patient refused    Emotionally Abused: Patient refused    Physically Abused: Patient refused    Sexually Abused: Patient refused   Family History  Problem Relation Age of Onset   Alcohol abuse Father    Physical abuse Father    High blood pressure Father    Physical abuse Sister    Alcohol abuse Brother    Physical abuse Brother    High blood pressure Brother    Gout Brother    OCD Paternal Uncle    High blood pressure Mother    Past Surgical History:  Procedure Laterality Date   TONSILLECTOMY        Vanessa Kick, MD 05/17/22 1132
